# Patient Record
Sex: Male | Born: 1975
Health system: Southern US, Community
[De-identification: ages and names within clinical notes are randomized; demographics above are authoritative.]

## PROBLEM LIST (undated history)

## (undated) DIAGNOSIS — K219 Gastro-esophageal reflux disease without esophagitis: Secondary | ICD-10-CM

## (undated) DIAGNOSIS — R4689 Other symptoms and signs involving appearance and behavior: Secondary | ICD-10-CM

## (undated) DIAGNOSIS — E7800 Pure hypercholesterolemia, unspecified: Secondary | ICD-10-CM

## (undated) DIAGNOSIS — T7840XA Allergy, unspecified, initial encounter: Secondary | ICD-10-CM

## (undated) DIAGNOSIS — M722 Plantar fascial fibromatosis: Secondary | ICD-10-CM

## (undated) DIAGNOSIS — M5030 Other cervical disc degeneration, unspecified cervical region: Secondary | ICD-10-CM

## (undated) DIAGNOSIS — J309 Allergic rhinitis, unspecified: Secondary | ICD-10-CM

## (undated) DIAGNOSIS — F419 Anxiety disorder, unspecified: Secondary | ICD-10-CM

## (undated) HISTORY — DX: Pure hypercholesterolemia, unspecified: E78.00

## (undated) HISTORY — PX: ESOPHAGOGASTRODUODENOSCOPY: SHX1529

## (undated) HISTORY — DX: Plantar fascial fibromatosis: M72.2

## (undated) HISTORY — DX: Allergic rhinitis, unspecified: J30.9

## (undated) HISTORY — DX: Anxiety disorder, unspecified: F41.9

## (undated) HISTORY — DX: Other cervical disc degeneration, unspecified cervical region: M50.30

## (undated) HISTORY — DX: Allergy, unspecified, initial encounter: T78.40XA

---
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
## 1999-11-01 ENCOUNTER — Encounter: Admission: RE | Admit: 1999-11-01 | Discharge: 1999-11-01 | Payer: Self-pay | Admitting: Sports Medicine

## 2000-04-03 ENCOUNTER — Encounter: Admission: RE | Admit: 2000-04-03 | Discharge: 2000-04-03 | Payer: Self-pay | Admitting: Sports Medicine

## 2001-05-07 ENCOUNTER — Encounter: Admission: RE | Admit: 2001-05-07 | Discharge: 2001-05-07 | Payer: Self-pay | Admitting: Family Medicine

## 2001-12-12 ENCOUNTER — Encounter: Admission: RE | Admit: 2001-12-12 | Discharge: 2001-12-12 | Payer: Self-pay | Admitting: Sports Medicine

## 2004-01-18 HISTORY — PX: ANKLE SURGERY: SHX546

## 2007-04-26 ENCOUNTER — Telehealth: Payer: Self-pay | Admitting: *Deleted

## 2007-05-08 ENCOUNTER — Ambulatory Visit: Payer: Self-pay | Admitting: Sports Medicine

## 2007-05-08 ENCOUNTER — Telehealth (INDEPENDENT_AMBULATORY_CARE_PROVIDER_SITE_OTHER): Payer: Self-pay | Admitting: *Deleted

## 2007-05-08 DIAGNOSIS — F0781 Postconcussional syndrome: Secondary | ICD-10-CM | POA: Insufficient documentation

## 2007-05-08 DIAGNOSIS — M542 Cervicalgia: Secondary | ICD-10-CM | POA: Insufficient documentation

## 2008-12-05 ENCOUNTER — Ambulatory Visit: Payer: Self-pay | Admitting: Family Medicine

## 2008-12-05 DIAGNOSIS — S6000XA Contusion of unspecified finger without damage to nail, initial encounter: Secondary | ICD-10-CM | POA: Insufficient documentation

## 2009-09-09 ENCOUNTER — Ambulatory Visit: Payer: Self-pay | Admitting: Sports Medicine

## 2009-09-09 ENCOUNTER — Encounter: Admission: RE | Admit: 2009-09-09 | Discharge: 2009-09-09 | Payer: Self-pay | Admitting: Sports Medicine

## 2009-09-09 DIAGNOSIS — M545 Low back pain, unspecified: Secondary | ICD-10-CM | POA: Insufficient documentation

## 2009-10-05 ENCOUNTER — Ambulatory Visit: Payer: Self-pay | Admitting: Sports Medicine

## 2009-11-30 ENCOUNTER — Ambulatory Visit: Payer: Self-pay | Admitting: Sports Medicine
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
## 2010-02-18 NOTE — Assessment & Plan Note (Signed)
Summary: F/U [facility]   Vital Signs:  Patient profile:   35 year old male BP sitting:   124 / 79  Vitals Entered By: Lillia Pauls CMA (October 05, 2009 11:41 AM)  History of Present Illness: Starting to improve Driving hurts worst than other activities lifting hurst some but not as much Xray shows sclerotic bone injury near post elements  now 10 weeks out feels like he is close to turning corner not really having to use pain meds but is using calcium and Vit D  Physical Exam  General:  Well-developed,well-nourished,in no acute distress; alert,appropriate and cooperative throughout examination Msk:  now has full ext and flex of Low back no real significant pain to touch one leg hyperexten is OK ipsilateral feels some tightnes on contralat leg standing good rotation and lat bend  Xrays reviewed w patient  norm heel, toe and tandem walk no weakness   Impression & Recommendations:  Problem # 1:  LOW BACK PAIN, ACUTE (ICD-724.2)  His updated medication list for this problem includes:    Tramadol Hcl 50 Mg Tabs (Tramadol hcl) .Marland Kitchen... 1 tab by mouth bid  as needed pain not controlled by alleve  small fx of post element of vertebrae This appears to be sclerotic on Xray and healing clinically as well  add some weight exercises as tolerated grad progress all activity avoid prolonged sitting or standing when possible  wean meds as tolerated by pain < 3/10  reck if not better in 4 wks  Complete Medication List: 1)  Amitriptyline Hcl 50 Mg Tabs (Amitriptyline hcl) .Marland Kitchen.. 1 tab by mouth at bedtime 2)  Tramadol Hcl 50 Mg Tabs (Tramadol hcl) .Marland Kitchen.. 1 tab by mouth bid  as needed pain not controlled by [employer]
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
## 2010-09-14 ENCOUNTER — Encounter: Payer: Self-pay | Admitting: Sports Medicine

## 2010-09-14 ENCOUNTER — Ambulatory Visit (INDEPENDENT_AMBULATORY_CARE_PROVIDER_SITE_OTHER): Payer: No Typology Code available for payment source | Admitting: Sports Medicine

## 2010-09-14 VITALS — BP 110/75 | HR 65 | Ht 72.0 in | Wt 165.0 lb

## 2010-09-14 DIAGNOSIS — M79609 Pain in unspecified limb: Secondary | ICD-10-CM

## 2010-09-14 DIAGNOSIS — M722 Plantar fascial fibromatosis: Secondary | ICD-10-CM | POA: Insufficient documentation

## 2010-09-14 DIAGNOSIS — M79673 Pain in unspecified foot: Secondary | ICD-10-CM

## 2010-09-14 NOTE — Progress Notes (Signed)
  Subjective:    Patient ID: Casey Garcia, male    DOB: 1975/07/20, 35 y.o.   MRN: [id number]  HPI  1. Heel pain. First noticed a soreness one week ago after running a longer distance and trying to alter running form to push off toes/forefoot more. 3 days after this soreness he woke up one morning with more severe heel pain in left>right. Worse with standing and putting pressure on the area. Has tried ice and infrequent motrin, no change. Denies numbness, tingling, weakness, trauma, joint pain.  Has a history of plantar fasciitis that was nagging injury lasting ~6 years. Also had a left tarsal tunnel release procedure several years ago that did not change his pain pattern.  Is using orthotics daily.   Review of Systems See HPI.     Objective:   Physical Exam  Vitals reviewed. Constitutional: He is oriented to person, place, and time. He appears well-developed and well-nourished. No distress.  Musculoskeletal: Normal range of motion. He exhibits no edema and no tenderness.       Nontender over calcaneus, achilles, peroneal retinaculum, plantar fascia. No nodules or deformities. No edema. No pain with full ROM-plantar/dorsiflexion.  Sensation intact.  Neurological: He is alert and oriented to person, place, and time. Coordination normal.    MSK [place]: Plantar fascia bilaterally intact with no increased doppler flow. R measuring 0.33cm and left measures 0.31cm. No abnormal fibers. Left tarsal tunnel/peroneal tendons appear wnl.       Assessment & Plan:

## 2010-09-14 NOTE — Assessment & Plan Note (Addendum)
Pain likely due to plantar strain from altered running form on forefoot, although no inflammation visualized on [place] today. Will start plantar stretch, heel raises and toe walking for HEP. Arch straps bilaterally. Recommend ice and motrin prn. Will follow up in 6 weeks prn if not improved.   Note he had a hx of chronic PF for several yrs No objective evidence today but will use exercise and stretch as preventive

## 2011-07-04 ENCOUNTER — Emergency Department (HOSPITAL_COMMUNITY)
Admission: EM | Admit: 2011-07-04 | Discharge: 2011-07-04 | Disposition: A | Discharge status: Home / Self Care | Payer: No Typology Code available for payment source | Source: Home / Self Care

## 2011-07-04 ENCOUNTER — Encounter (HOSPITAL_COMMUNITY): Payer: Self-pay | Admitting: *Deleted

## 2011-07-04 DIAGNOSIS — H938X9 Other specified disorders of ear, unspecified ear: Secondary | ICD-10-CM

## 2011-07-04 NOTE — Discharge Instructions (Signed)
Thank you for coming in today. I am not sure exactly what is causing your sensation. Please call and make an appointment with [facility] ear nose and throat.   This should count is a referral but will be happy to send a letter if needed.  If it gets dramatically worse placed in the emergency room. Call the family practice Center to get set up with a new doctor or consider calling [facility] or [place] primary care.

## 2011-07-04 NOTE — ED Provider Notes (Signed)
Casey Garcia is a 36 y.o. male who presents to Urgent Care today for left ear sensation.  Patient is to 3 weeks of a squeaking or popping noise in his left ear.  This does not occur with pain or vertigo.  He denies any injury to his ear nor exposure to loud noises recently.  He feels well otherwise.    PMH reviewed. Otherwise healthy History  Substance Use Topics  . Smoking status: Never Smoker   . Smokeless tobacco: Not on file  . Alcohol Use: Yes   ROS as above Medications reviewed. No current facility-administered medications for this encounter.   No current outpatient prescriptions on file.    Exam:  BP 109/79  Pulse 64  Temp 98.4 F (36.9 C) (Oral)  Resp 16  SpO2 100% Gen: Well NAD HEENT: EOMI,  MMM, tympanic membranes are normal appearing bilaterally.  No TMJ pain with jaw motion. Lungs: CTABL Nl WOB Heart: RRR no MRG    Assessment and Plan: 36 y.o. male with left ear sensation.  His ears are normal appearing. His hearing appears to be normal as well.  As is sensation is been going on for 3 weeks I feel that referral to ear nose and throat is reasonable.  Recommend following up with [facility] for further evaluation and management. Discussed warning signs or symptoms. Please see discharge instructions. Patient expresses understanding.      Rodolph Bong, MD 07/04/11 [phone]

## 2011-07-04 NOTE — ED Provider Notes (Signed)
Medical screening examination/treatment/procedure(s) were performed by a resident physician and as supervising physician I was immediately available for consultation/collaboration.  Leslee Home, M.D.   Reuben Likes, MD 07/04/11 2101

## 2011-07-04 NOTE — ED Notes (Signed)
PT REPORTS LEFT EAR PAIN RATED AT 1/10

## 2012-04-16 ENCOUNTER — Ambulatory Visit (INDEPENDENT_AMBULATORY_CARE_PROVIDER_SITE_OTHER): Payer: BC Managed Care – PPO | Admitting: Family Medicine

## 2012-04-16 ENCOUNTER — Encounter: Payer: Self-pay | Admitting: Family Medicine

## 2012-04-16 VITALS — BP 116/77 | HR 73 | Ht 72.0 in | Wt 165.0 lb

## 2012-04-16 DIAGNOSIS — IMO0002 Reserved for concepts with insufficient information to code with codable children: Secondary | ICD-10-CM

## 2012-04-16 DIAGNOSIS — S76319A Strain of muscle, fascia and tendon of the posterior muscle group at thigh level, unspecified thigh, initial encounter: Secondary | ICD-10-CM | POA: Insufficient documentation

## 2012-04-16 DIAGNOSIS — S76311A Strain of muscle, fascia and tendon of the posterior muscle group at thigh level, right thigh, initial encounter: Secondary | ICD-10-CM

## 2012-04-16 NOTE — Progress Notes (Signed)
Subjective: 37 yo male presenting for evaluation of right hamstring injury.  Was playing soccer last night (3/30) and was running towards the ball and suddenly felt a "pop" in his posterior right thigh and went down to the field.  He continued to run and play the remainder of the first half and then took himself out secondary to pain.  Went home iced the injury and took Aleve for pain.  Since last night he has been ambulatory, complaining of right medial posterior thigh pain without radiation.  Pain is worse with hip and knee flexion.  Better with rest.  Denies any ecchymosis, buttocks pain, hip pain, knee pain, swelling.    Objective: BP 116/77  Pulse 73  Ht 6' (1.829 m)  Wt 165 lb (74.844 kg)  BMI 22.37 kg/m2 Gen:  NAD, well appearing.  Skin:  Well perfused, warm and dry.  Cap refill < 2 seconds Psych:  Alert and oriented x 3.  Msk:  Right thigh without obvious deformity or ecchymosis.  Tender to palpation on the posterior medial aspect of the thigh, with tight fibrous band noted.  ROM is decreased in hip flexion, full ROM in knee extension/flexion.  Strength 5/5 in hip, knee flexors/extensors.  Neurovascular intact distally on bilateral lower extremities.    Limited right hamstring ultrasound in transverse and long views demonstrated small hypoechogenicity in the hamstring muscle belly, no complete tear or rupture present.    Assessment: 37 yo male with acute onset hamstring pain following soccer injury last night with good hamstring strength, no ecchymosis, and ultrasound evidence of possible partial tear.    Plan:  1.  Right hamstring injury   --Discussed prognosis and prevention of future events with patient.  Handouts were given for hamstring strengthening exercises to be performed at home, he was educated in the office.  Compression sleeve given today in office to be worn during the day.  Asked the patient to please ice affected area twice daily and to take anti inflammatory for pain  control.  Instructed to withhold from running/kicking for 2 weeks then return to play as tolerated.    Stephanie Coup. Arvilla Market, DO [facility] Attending Note: I have seen and examined this patient. I have discussed this patient with the resident and reviewed the assessment and plan as documented above. I agree with the resident's findings and plan.

## 2012-04-16 NOTE — Patient Instructions (Addendum)
Hamstring curls: Start with 3 sets of 15 (no weight); Progress by 5 reps every 3 days until you reach 3 sets of 30; After 3 days at 3 sets of 30, add 2lb ankle weight at 3 sets of 10; Increase every 5 days by 5 reps. You may add 2lbs ankle weight once weekly. Hamstring swings- swing leg backwards and curl at the end of the swing. Follow same schedule as above. Hamstring running lunges- running lunge position means no more than 45 degrees of knee flexion and running motion. Follow same schedule as above.  Plyometrics: bound and land in running position; 3 sets of 15 level first; then up a rise; then down a rise; only advance when level is easy. No greater than 45 deg knee flexion. Stride drills- 10 x 75 yards; bounding but no more than 45 deg knee flexion. Level for 1 week; then add up hill for 1 week; then add down hill. Hop drills- 15 repetitive hops- 3 sets; compare injured to non injured leg

## 2012-04-17 ENCOUNTER — Encounter: Payer: Self-pay | Admitting: Family Medicine

## 2014-11-13 ENCOUNTER — Encounter (HOSPITAL_COMMUNITY): Payer: Self-pay | Admitting: *Deleted

## 2014-11-13 ENCOUNTER — Emergency Department (HOSPITAL_COMMUNITY)
Admission: EM | Admit: 2014-11-13 | Discharge: 2014-11-13 | Disposition: A | Discharge status: Home / Self Care | Payer: 59 | Source: Home / Self Care | Attending: Family Medicine | Admitting: Family Medicine

## 2014-11-13 DIAGNOSIS — J302 Other seasonal allergic rhinitis: Secondary | ICD-10-CM | POA: Diagnosis not present

## 2014-11-13 MED ORDER — METHYLPREDNISOLONE ACETATE 80 MG/ML IJ SUSP
INTRAMUSCULAR | Status: AC
  Filled 2014-11-13: qty 1

## 2014-11-13 MED ORDER — CETIRIZINE HCL 10 MG PO TABS: 10.0000 mg | ORAL_TABLET | Freq: Every day | ORAL | Status: DC

## 2014-11-13 MED ORDER — METHYLPREDNISOLONE ACETATE 40 MG/ML IJ SUSP
80.0000 mg | Freq: Once | INTRAMUSCULAR | Status: AC
  Administered 2014-11-13: 80 mg via INTRAMUSCULAR

## 2014-11-13 MED ORDER — FLUTICASONE PROPIONATE 50 MCG/ACT NA SUSP: 1.0000 | Freq: Two times a day (BID) | NASAL | Status: DC

## 2014-11-13 NOTE — ED Notes (Signed)
Pt   Reports   Symptoms  Of  Cough  /   Congestion       sorethroat    For  Several   Weeks       Symptoms  Not  releived  By otc  meds

## 2014-11-13 NOTE — ED Provider Notes (Signed)
CSN: [id number]     Arrival date & time 11/13/14  1936 History   First MD Initiated Contact with Patient 11/13/14 1955     Chief Complaint  Patient presents with  . URI   (Consider location/radiation/quality/duration/timing/severity/associated sxs/prior Treatment) Patient is a 39 y.o. male presenting with URI. The history is provided by the patient.  URI Presenting symptoms: congestion, cough, rhinorrhea and sore throat   Presenting symptoms: no fever   Severity:  Mild Onset quality:  Gradual Duration:  2 weeks Progression:  Waxing and waning Chronicity:  New Ineffective treatments:  OTC medications Associated symptoms: sneezing   Associated symptoms: no wheezing   Risk factors: sick contacts     History reviewed. No pertinent past medical history. History reviewed. No pertinent past surgical history. History reviewed. No pertinent family history. Social History  Substance Use Topics  . Smoking status: Never Smoker   . Smokeless tobacco: None  . Alcohol Use: Yes    Review of Systems  Constitutional: Negative.  Negative for fever.  HENT: Positive for congestion, postnasal drip, rhinorrhea, sneezing and sore throat.   Respiratory: Positive for cough. Negative for shortness of breath and wheezing.   Cardiovascular: Negative.   Gastrointestinal: Negative.   All other systems reviewed and are negative.   Allergies  Review of patient's allergies indicates no known allergies.  Home Medications   Prior to Admission medications   Medication Sig Start Date End Date Taking? Authorizing Provider  Fexofenadine HCl (MUCINEX ALLERGY PO) Take by mouth.   Yes Historical Provider, MD  cetirizine (ZYRTEC) 10 MG tablet Take 1 tablet (10 mg total) by mouth at bedtime. One tab daily for allergies 11/13/14   Billy Fischer, MD  fluticasone [facility]) 50 MCG/ACT nasal spray Place 1 spray into both nostrils 2 (two) times daily. 11/13/14   Billy Fischer, MD   Meds Ordered and Administered  this Visit   Medications  methylPREDNISolone acetate (DEPO-MEDROL) injection 80 mg (80 mg Intramuscular Given 11/13/14 2014)    BP 114/72 mmHg  Pulse 78  Temp(Src) 98.6 F (37 C)  Resp 18  SpO2 99% No data found.   Physical Exam  Constitutional: He is oriented to person, place, and time. He appears well-developed and well-nourished. No distress.  HENT:  Right Ear: External ear normal.  Left Ear: External ear normal.  Mouth/Throat: Oropharynx is clear and moist.  Eyes: Pupils are equal, round, and reactive to light.  Neck: Normal range of motion. Neck supple.  Cardiovascular: Normal heart sounds and intact distal pulses.   Pulmonary/Chest: Effort normal and breath sounds normal.  Lymphadenopathy:    He has no cervical adenopathy.  Neurological: He is alert and oriented to person, place, and time.  Skin: Skin is warm and dry.  Nursing note and vitals reviewed.   ED Course  Procedures (including critical care time)  Labs Review Labs Reviewed - No data to display  Imaging Review No results found.   Visual Acuity Review  Right Eye Distance:   Left Eye Distance:   Bilateral Distance:    Right Eye Near:   Left Eye Near:    Bilateral Near:         MDM   1. Seasonal allergic rhinitis        Billy Fischer, MD 11/13/14 2052

## 2015-03-17 LAB — BASIC METABOLIC PANEL
BUN: 17 mg/dL (ref 4–21)
CREATININE: 1 mg/dL (ref 0.6–1.3)
Glucose: 87 mg/dL
POTASSIUM: 4.1 mmol/L (ref 3.4–5.3)
Sodium: 140 mmol/L (ref 137–147)

## 2015-03-17 LAB — LIPID PANEL
CHOLESTEROL: 247 mg/dL — AB (ref 0–200)
HDL: 54 mg/dL (ref 35–70)
LDL Cholesterol: 174 mg/dL
TRIGLYCERIDES: 95 mg/dL (ref 40–160)

## 2015-03-17 LAB — CBC AND DIFFERENTIAL
HCT: 46 % (ref 41–53)
Hemoglobin: 15 g/dL (ref 13.5–17.5)
Platelets: 149 10*3/uL — AB (ref 150–399)
WBC: 3.6 10^3/mL

## 2015-03-17 LAB — HEPATIC FUNCTION PANEL
ALT: 43 U/L — AB (ref 10–40)
AST: 22 U/L (ref 14–40)
Alkaline Phosphatase: 52 U/L (ref 25–125)
BILIRUBIN, TOTAL: 0.5 mg/dL

## 2015-03-17 LAB — PSA: PSA: 0.2

## 2015-04-03 ENCOUNTER — Encounter: Payer: Self-pay | Admitting: Family Medicine

## 2015-04-03 ENCOUNTER — Ambulatory Visit (INDEPENDENT_AMBULATORY_CARE_PROVIDER_SITE_OTHER): Payer: 59 | Admitting: Family Medicine

## 2015-04-03 VITALS — BP 109/77 | HR 61 | Temp 97.8°F | Ht 72.0 in | Wt 177.0 lb

## 2015-04-03 DIAGNOSIS — R5383 Other fatigue: Secondary | ICD-10-CM | POA: Insufficient documentation

## 2015-04-03 DIAGNOSIS — L659 Nonscarring hair loss, unspecified: Secondary | ICD-10-CM

## 2015-04-03 DIAGNOSIS — E7800 Pure hypercholesterolemia, unspecified: Secondary | ICD-10-CM

## 2015-04-03 DIAGNOSIS — R200 Anesthesia of skin: Secondary | ICD-10-CM

## 2015-04-03 DIAGNOSIS — R5382 Chronic fatigue, unspecified: Secondary | ICD-10-CM

## 2015-04-03 LAB — TSH: TSH: 1.23 m[IU]/L (ref 0.40–4.50)

## 2015-04-03 MED ORDER — ATORVASTATIN CALCIUM 20 MG PO TABS: 20.0000 mg | ORAL_TABLET | Freq: Every day | ORAL | Status: DC

## 2015-04-03 NOTE — Assessment & Plan Note (Signed)
Nighttime extremity numbness consistent with nerve palsy from sleep position. Vascular and Neuro exam unremarkable. -patient to keep journal of symtpoms -patient reassured of likely benign etiology of symptoms.

## 2015-04-03 NOTE — Assessment & Plan Note (Signed)
Total cholesterol elevated on recent lab work. -Discussed lifestyle modifications, patient interested however is realistic that he will not be able to exercise regularly or consume a health diet. Interested in pharmacotherapy.  -start Lipitor 20 mg daily, recheck lipid panel in 3 months.

## 2015-04-03 NOTE — Progress Notes (Signed)
   Subjective:    Patient ID: Casey Garcia, male    DOB: 1975/08/11, 40 y.o.   MRN: [id number]  HPI 40 y/o male presents to re-establish care.  Arms/legs falling asleep - waking up multiple times per night with numb hand and feet. Used to be would fall asleep on hand and it would fall asleep, no happens in all positions, symptoms last approx. 5 minutes, does not notice symptoms during the day  Diet - elevated cholesterol, eats a poor diet, lots of red meat, few vegetables, lots of processed food  Previous hair loss - on Propecia, on Finasteride, no new changes in bladder function, no weak flow, no nocturia, still has some receding hairline. Has used foam rogaine in the past but saw little results.   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   Review of Systems  Constitutional: Negative for fever, chills and fatigue.  Respiratory: Negative for cough and shortness of breath.   Cardiovascular: Negative for chest pain.  Gastrointestinal: Negative for nausea, vomiting and diarrhea.       Objective:   Physical Exam BP 109/77 mmHg  Pulse 61  Temp(Src) 97.8 F (36.6 C) (Oral)  Ht 6' (1.829 m)  Wt 177 lb (80.287 kg)  BMI 24.00 kg/m2  Gen: pleasant male, NAD HEENT: normocephalic, PERRL, EOMI, nasal septum midline, MMM, uvula midline, neck supple, no thyromegaly, no adenopathy Cardiac: RRR, S1 and S2 present, no murmur, no heaves/thrills Resp: CTAB, normal effort Abd: soft, no tenderness, normal bowel sounds Neuro: CN 2-12 intact, strength 5/5 in bilateral UE/LE. Sensation to light touch grossly intact. Normal gait. 2+ patellar/brachialradialis reflexes, no clonus Vascular: no carotid bruits, 2+ radial and DP  pulses, extremities well perfused  Labs: 03/20/15 (copy scanned into chart, also entered under abstract encounter)  Chol. 247, HDL 54, LDL 174 CMP and CBC normal except WBC 3.6 UA normal PSA normal 0.2     Assessment & Plan:  Pure hypercholesterolemia Total cholesterol elevated on recent lab work. -Discussed lifestyle modifications, patient interested however is realistic that he will not be able to exercise regularly or consume a health diet. Interested in pharmacotherapy.  -start Lipitor 20 mg daily, recheck lipid panel in 3 months.   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 Patient on Propecia.  -patient given option to discontinue Propecia as he has noticed little improvement.  -re-evaluate at subsequent visits.   Extremity numbness Nighttime extremity numbness consistent with nerve palsy from sleep position. Vascular and Neuro exam unremarkable. -patient to keep journal of symtpoms -patient reassured of likely benign etiology of symptoms.

## 2015-04-03 NOTE — Patient Instructions (Addendum)
It was nice to see you again.   Hair loss - I will leave it up to you if you would like to stop the propecia  High Cholesterol - attempt to make small improvements in your diet, start Lipitor 20 mg daily.  Hand/Foot numbness - your circulation and nervous system are normal on my exam today. Keep a journal at your bedside - record what symptoms you have and what position you were in.   Return in 3 months for recheck of cholesterol. My come back sooner if you are having continued symptoms in your hands and feet.

## 2015-04-03 NOTE — Assessment & Plan Note (Signed)
Male pattern hair loss. Patient on Propecia.  -patient given option to discontinue Propecia as he has noticed little improvement.  -re-evaluate at subsequent visits.

## 2015-04-04 LAB — VITAMIN D 25 HYDROXY (VIT D DEFICIENCY, FRACTURES): Vit D, 25-Hydroxy: 19 ng/mL — ABNORMAL LOW (ref 30–100)

## 2015-04-06 ENCOUNTER — Telehealth: Payer: Self-pay | Admitting: Family Medicine

## 2015-04-06 DIAGNOSIS — E559 Vitamin D deficiency, unspecified: Secondary | ICD-10-CM

## 2015-04-06 NOTE — Telephone Encounter (Signed)
Discussed TSH and Vit D results. Start daily Vit D otc 1000-2000 IU Daily.

## 2015-04-14 ENCOUNTER — Encounter: Payer: Self-pay | Admitting: Family Medicine

## 2015-06-19 ENCOUNTER — Encounter: Payer: Self-pay | Admitting: Family Medicine

## 2015-06-19 ENCOUNTER — Ambulatory Visit (INDEPENDENT_AMBULATORY_CARE_PROVIDER_SITE_OTHER): Payer: 59 | Admitting: Family Medicine

## 2015-06-19 VITALS — BP 117/72 | HR 61 | Ht 72.0 in | Wt 178.2 lb

## 2015-06-19 DIAGNOSIS — E7800 Pure hypercholesterolemia, unspecified: Secondary | ICD-10-CM

## 2015-06-19 DIAGNOSIS — Z23 Encounter for immunization: Secondary | ICD-10-CM

## 2015-06-19 DIAGNOSIS — Q211 Atrial septal defect, unspecified: Secondary | ICD-10-CM | POA: Insufficient documentation

## 2015-06-19 DIAGNOSIS — R200 Anesthesia of skin: Secondary | ICD-10-CM | POA: Diagnosis not present

## 2015-06-19 DIAGNOSIS — M5030 Other cervical disc degeneration, unspecified cervical region: Secondary | ICD-10-CM

## 2015-06-19 LAB — LIPID PANEL
CHOL/HDL RATIO: 2.3 ratio (ref <=5.0)
CHOLESTEROL: 142 mg/dL (ref 125–200)
HDL: 61 mg/dL (ref >=40)
LDL Cholesterol: 67 mg/dL (ref <130)
TRIGLYCERIDES: 71 mg/dL (ref <150)
VLDL: 14 mg/dL (ref <30)

## 2015-06-19 NOTE — Assessment & Plan Note (Signed)
Patient reports previous C5-C6 cervical disc injury. DDD may be contributing to symptoms however I would expect daytime symptoms or + exam findings.  -no plan for imaging at this time however if hand numbness persists consider MRI cervical spine.

## 2015-06-19 NOTE — Assessment & Plan Note (Addendum)
Patient continues to have bilateral (R>L) arm/hand numbness at night with certain positions. Has also noted when laying in bed and one phone. DDX. Includes Cervical Spine Disease (known history of C5-C6 disk injury) vs Thoracic outlet syndrome. Neurologic and cervical spine exam unremarkable. Discussed possible nerve conduction study vs CTA to evaluate for nerve/vein/arterial impingement. Patient declined this workup at that time. -will check chest xray to evaluate ribs (first rib abnormalities have been shown to cause brachial plexus impingement).

## 2015-06-19 NOTE — Progress Notes (Signed)
   Subjective:    Patient ID: Casey Garcia, male    DOB: 06-12-1975, 40 y.o.   MRN: [id number]  HPI    Review of Systems     Objective:   Physical Exam        Assessment & Plan:

## 2015-06-19 NOTE — Patient Instructions (Signed)
It was nice to see you today.  Cholesterol - continue Lipitor, check levels today  Hand numbness - possibly thoracic outlet syndrome, check xray of chest, consider nerve conduction study and CT angiogram (to look at blood vessels in head/neck).

## 2015-06-19 NOTE — Assessment & Plan Note (Signed)
History of ASD with left to right shunt (shown on previous heart catheterization). Records scanned into chart (last cardiac visit was in 2004).  -check Echo.

## 2015-06-19 NOTE — Progress Notes (Signed)
   Subjective:    Patient ID: Casey Garcia, male    DOB: [birth year], 40 y.o.   MRN: [id number]  HPI 40 y/o male presents for follow up of hand numbness and HLD.  Hand Numbness - has continued to notice intermittent hand numbness in the middle of the night, typically occurs when arms are flexed/bent or when arm is up over the head. Has noticed some symptoms when he is on his phone during the day, symptoms resolve typically after 3-5 minutes. No change in color of the extremities. He also reports one episode of right leg/testicle numbness since last visit that lasted 5-10 minutes.   HLD - started on Lipitor 20 mg at last visit, no side effects  Atrioseptal defect - seen by cardiology in 2004, had heart catheter which showed left to right shunt, was supposed to get Echo in 3 years. (office notes scanned into chart)  Social - nonsmoker  Previous injuries:  Had C5-C6 injury 15 years ago (disk injury). Lumbar fracture in 2011 ([facility]. [facility])    Review of Systems     Objective:   Physical Exam BP 117/72 mmHg  Pulse 61  Ht 6' (1.829 m)  Wt 178 lb 3.2 oz (80.831 kg)  BMI 24.16 kg/m2    Lumbar Spine Xray 09/09/09 negative    Assessment & Plan:  Extremity numbness Patient continues to have bilateral (R>L) arm/hand numbness at night with certain positions. Has also noted when laying in bed and one phone. DDX. Includes Cervical Spine Disease (known history of C5-C6 disk injury) vs Thoracic outlet syndrome. Neurologic and cervical spine exam unremarkable. Discussed possible nerve conduction study vs CTA to evaluate for nerve/vein/arterial impingement. Patient declined this workup at that time. -will check chest xray to evaluate ribs (first rib abnormalities have been shown to cause brachial plexus impingement).   DDD (degenerative disc disease), cervical Patient reports previous C5-C6 cervical disc injury. DDD may be contributing to symptoms however I would expect  daytime symptoms or + exam findings.  -no plan for imaging at this time however if hand numbness persists consider MRI cervical spine.   Pure hypercholesterolemia Patient tolerating Lipitor started at last visit. -check lipid profile to monitor treatment   ASD (atrial septal defect) History of ASD with left to right shunt (shown on previous heart catheterization). Records scanned into chart (last cardiac visit was in 2004).  -check Echo.

## 2015-06-19 NOTE — Assessment & Plan Note (Signed)
Patient tolerating Lipitor started at last visit. -check lipid profile to monitor treatment

## 2015-06-20 LAB — HIV ANTIBODY (ROUTINE TESTING W REFLEX): HIV 1&2 Ab, 4th Generation: NONREACTIVE

## 2015-06-24 ENCOUNTER — Encounter: Payer: Self-pay | Admitting: Family Medicine

## 2015-07-08 ENCOUNTER — Other Ambulatory Visit (HOSPITAL_COMMUNITY): Payer: 59

## 2015-07-15 ENCOUNTER — Other Ambulatory Visit (HOSPITAL_COMMUNITY): Payer: 59

## 2015-07-17 ENCOUNTER — Ambulatory Visit (HOSPITAL_COMMUNITY): Payer: 59

## 2015-07-20 ENCOUNTER — Other Ambulatory Visit: Payer: Self-pay | Admitting: *Deleted

## 2015-07-20 DIAGNOSIS — E7800 Pure hypercholesterolemia, unspecified: Secondary | ICD-10-CM

## 2015-07-22 ENCOUNTER — Ambulatory Visit (HOSPITAL_COMMUNITY): Payer: 59 | Attending: Cardiovascular Disease

## 2015-07-22 ENCOUNTER — Other Ambulatory Visit: Payer: Self-pay

## 2015-07-22 DIAGNOSIS — Q211 Atrial septal defect, unspecified: Secondary | ICD-10-CM

## 2015-07-22 DIAGNOSIS — E785 Hyperlipidemia, unspecified: Secondary | ICD-10-CM | POA: Diagnosis not present

## 2015-07-22 DIAGNOSIS — I517 Cardiomegaly: Secondary | ICD-10-CM | POA: Diagnosis not present

## 2015-07-22 LAB — ECHOCARDIOGRAM COMPLETE
AOASC: 33 cm
CHL CUP DOP CALC LVOT VTI: 21.6 cm
E/e' ratio: 8.42
EWDT: 290 ms
FS: 38 % (ref 28–44)
IVS/LV PW RATIO, ED: 1.17
LA ID, A-P, ES: 37 mm
LA vol A4C: 43.4 ml
LA vol: 48.3 mL
LADIAMINDEX: 1.82 cm/m2
LAVOLIN: 23.8 mL/m2
LEFT ATRIUM END SYS DIAM: 37 mm
LV E/e' medial: 8.42
LV TDI E'LATERAL: 8.81
LV TDI E'MEDIAL: 8.92
LVEEAVG: 8.42
LVELAT: 8.81 cm/s
LVOT area: 4.91 cm2
LVOT diameter: 25 mm
LVOT peak vel: 98.3 cm/s
LVOTSV: 106 mL
MV Dec: 290
MVPG: 2 mmHg
MVPKAVEL: 38.3 m/s
MVPKEVEL: 74.2 m/s
PW: 9.81 mm — AB (ref 0.6–1.1)
TAPSE: 25 mm

## 2015-07-24 ENCOUNTER — Telehealth: Payer: Self-pay | Admitting: Family Medicine

## 2015-07-24 ENCOUNTER — Encounter: Payer: Self-pay | Admitting: Family Medicine

## 2015-07-24 ENCOUNTER — Telehealth: Payer: Self-pay | Admitting: Interventional Cardiology

## 2015-07-24 DIAGNOSIS — I517 Cardiomegaly: Secondary | ICD-10-CM | POA: Insufficient documentation

## 2015-07-24 NOTE — Telephone Encounter (Signed)
Attempted to contact patient with Echo results, no answer, left message that I would send letter with results.

## 2015-07-24 NOTE — Telephone Encounter (Signed)
Pt was referred to see Cardiologist and is a family friend of Dr. Tamala Julian so wants to see him-next available it 09-17-15-pt wants to know if he can be seen sooner-pls call

## 2015-07-24 NOTE — Telephone Encounter (Signed)
Pt called because he received a call from Dr. Ree Kida about the letter and also received a call from a cardiologist to make an appointment. He is now concerned and would like to speak to the doctor. Please call back. jw

## 2015-07-24 NOTE — Telephone Encounter (Signed)
Returned patient call. Discussed Echo findings. Mildly enlarged right ventricle/atrium. Referral to cardiology.

## 2015-07-27 NOTE — Telephone Encounter (Signed)
Returned pt call. Pt consult sch with Dr.Smith for 8/8 @ 4pm. Pt voiced appreciation for the call.

## 2015-08-25 ENCOUNTER — Encounter: Payer: Self-pay | Admitting: Interventional Cardiology

## 2015-08-25 ENCOUNTER — Ambulatory Visit (INDEPENDENT_AMBULATORY_CARE_PROVIDER_SITE_OTHER): Payer: 59 | Admitting: Interventional Cardiology

## 2015-08-25 VITALS — BP 106/74 | HR 61 | Ht 72.0 in | Wt 180.0 lb

## 2015-08-25 DIAGNOSIS — Q211 Atrial septal defect, unspecified: Secondary | ICD-10-CM

## 2015-08-25 NOTE — Patient Instructions (Addendum)
Medication Instructions:  Your physician recommends that you continue on your current medications as directed. Please refer to the Current Medication list given to you today.   Labwork: None ordered  Testing/Procedures: Your physician has requested that you have an echocardiogram. Echocardiography is a painless test that uses sound waves to create images of your heart. It provides your doctor with information about the size and shape of your heart and how well your heart's chambers and valves are working. This procedure takes approximately one hour. There are no restrictions for this procedure. (To be scheduled in 3 years)  Follow-Up: Your physician wants you to follow-up in: 3 years with cardiology You will receive a reminder letter in the mail two months in advance. If you don't receive a letter, please call our office to schedule the follow-up appointment.   Any Other Special Instructions Will Be Listed Below (If Applicable).     If you need a refill on your cardiac medications before your next appointment, please call your pharmacy.

## 2015-08-25 NOTE — Progress Notes (Signed)
Cardiology Office Note    Date:  08/25/2015   ID:  Casey Garcia, DOB September 09, 1975, MRN [id number]  PCP:  Lupita Dawn, MD  Cardiologist: Sinclair Grooms, MD   Chief Complaint  Patient presents with  . Atrial Septal Defect    History of Present Illness:  Casey Garcia is a 40 y.o. male with a prior history of atrial septal defect. Diagnosed in 2003. He is asymptomatic. He was told 14 years ago to have follow-up echo in 2006 . He is now following up at the encouragement of his primary care physician.  Evaluation in 2003  identified a small atrial septal defect without significant shunting. The ASD was apparently identified by both transthoracic echo and transesophageal echocardiography. Heart catheterization with a shunt run identified a 1.1:1 Qp/Qs.. Mr. Casey Garcia is asymptomatic. He has no complaints of exertional dyspnea, edema, syncope, palpitations, or physical limitations. He denies chest pain. He would not have come for an appointment had not been for his primary care physician who encouraged follow-up.  He complains that he does not sleep well. He is not known to snore.  There is a family history of coronary artery disease. He recently started statin therapy because of significant elevation in LDL cholesterol.  He does not smoke.  No past medical history on file.  Past Surgical History:  Procedure Laterality Date  . ANKLE SURGERY Left 2006   Tarsel Tunnel Release  . KNEE SURGERY  1990   Bilateral knee scope    Current Medications: Outpatient Medications Prior to Visit  Medication Sig Dispense Refill  . atorvastatin (LIPITOR) 20 MG tablet Take 1 tablet (20 mg total) by mouth daily. 90 tablet 3  . cholecalciferol (VITAMIN D) 1000 units tablet Take 1,000 Units by mouth daily.    . finasteride (PROPECIA) 1 MG tablet Take 1 mg by mouth daily.     No facility-administered medications prior to visit.      Allergies:   Review of patient's allergies indicates no known  allergies.   Social History   Social History  . Marital status: Single    Spouse name: N/A  . Number of children: N/A  . Years of education: N/A   Social History Main Topics  . Smoking status: Never Smoker  . Smokeless tobacco: Never Used  . Alcohol use 4.2 - 7.2 oz/week    7 - 12 Standard drinks or equivalent per week  . Drug use: No  . Sexual activity: Not Asked   Other Topics Concern  . None   Social History Chief Operating Officer, mostly apartments (historic rehabilitation)   Not married, no kids   Grew up in [place]        Family History:  The patient's father had coronary bypass surgery at age 63. Myocardial infarction prior to age 96.  ROS:   Please see the history of present illnessdifficulty sleeping. Anxiety and stress. Muscle pain. Occasional diarrhea.   All other systems reviewed and are negative.   PHYSICAL EXAM:   VS:  BP 106/74   Pulse 61   Ht 6' (1.829 m)   Wt 180 lb (81.6 kg)   BMI 24.41 kg/m    GEN: Well nourished, well developed, in no acute distress  HEENT: normal  Neck: no JVD, carotid bruits, or masses Cardiac:RRR; no murmurs, rubs, or gallops,no edema . S2 is physiologically split.  Respiratory:  clear to auscultation bilaterally, normal work of breathing GI: soft, nontender, nondistended, +  BS MS: no deformity or atrophy  Skin: warm and dry, no rash Neuro:  Alert and Oriented x 3, Strength and sensation are intact Psych: euthymic mood, full affect  Wt Readings from Last 3 Encounters:  08/25/15 180 lb (81.6 kg)  06/19/15 178 lb 3.2 oz (80.8 kg)  04/03/15 177 lb (80.3 kg)      Studies/Labs Reviewed:   EKG:  EKG  Normal sinus rhythm, incomplete right bundle, normal atrial size, and no significant ST-T wave change. When compared to his electrocardiogram performed on 03/14/2001 no significant changes noted Recent Labs: 03/17/2015: ALT 43; BUN 17; Creatinine 1.0; Hemoglobin 15.0; Platelets 149; Potassium 4.1; Sodium  140 04/03/2015: TSH 1.23   Lipid Panel    Component Value Date/Time   CHOL 142 06/19/2015 0900   TRIG 71 06/19/2015 0900   HDL 61 06/19/2015 0900   CHOLHDL 2.3 06/19/2015 0900   VLDL 14 06/19/2015 0900   LDLCALC 67 06/19/2015 0900    Additional studies/ records that were reviewed today include:  Echocardiogram, 07/22/15  Study Conclusions  - Left ventricle: The cavity size was normal. Systolic function was   normal. The estimated ejection fraction was in the range of 55%   to 60%. Wall motion was normal; there were no regional wall   motion abnormalities. Left ventricular diastolic function   parameters were normal.  Impressions:  - Borderline enlargement of right heart cavities. An atrial septal   defect is not identified on 2D or color Doppler imaging. Flow   calculations do not support a significant left to right shunt. If   there is a high level of suspicion for ASD, consider TEE   evaluation.   ASSESSMENT:    1. ASD (atrial septal defect)      PLAN:  In order of problems listed above:  1. Based upon review of available data, it is likely that Casey Garcia has a small ASD. However, ASD was not confirmed by the recent  transthoracic echo on the particular examination noted above. A TEE, transthoracic echo, and heart catheterization in 2003 all suggested the presence of a small ASD or a patent foramen ovale. The finding of "borderline enlargement of right heart cavities" raises some concern. I have reviewed the echocardiographic data personally. I don't disagree with the assessment noted. There is no evidence of atrial septal shunting by Doppler interrogation. The patient has no particular symptoms. There are no clinical findings to suggest right heart volume overload. The plan is follow-up echocardiogram in 2- 3 years. Earlier evaluation if symptoms or signs of volume overload which would include edema, exertional intolerance, and arrhythmia.      Review of available  data which includes Medication Adjustments/Labs and Tests Ordered: Current medicines are reviewed at length with the patient today.  Concerns regarding medicines are outlined above.  Medication changes, Labs and Tests ordered today are listed in the Patient Instructions below. Patient Instructions  Medication Instructions:  Your physician recommends that you continue on your current medications as directed. Please refer to the Current Medication list given to you today.   Labwork: None ordered  Testing/Procedures: Your physician has requested that you have an echocardiogram. Echocardiography is a painless test that uses sound waves to create images of your heart. It provides your doctor with information about the size and shape of your heart and how well your heart's chambers and valves are working. This procedure takes approximately one hour. There are no restrictions for this procedure. (To be scheduled in 3 years)  Follow-Up: Your physician wants you to follow-up in: 3 years with cardiology You will receive a reminder letter in the mail two months in advance. If you don't receive a letter, please call our office to schedule the follow-up appointment.   Any Other Special Instructions Will Be Listed Below (If Applicable).     If you need a refill on your cardiac medications before your next appointment, please call your pharmacy.      Signed, Sinclair Grooms, MD  08/25/2015 5:25 PM    [facility] [address], [place], [place]  [address] Phone: [phone]; Fax: [phone]

## 2015-11-23 ENCOUNTER — Telehealth: Payer: Self-pay

## 2015-11-23 DIAGNOSIS — E7800 Pure hypercholesterolemia, unspecified: Secondary | ICD-10-CM

## 2015-11-23 MED ORDER — ATORVASTATIN CALCIUM 20 MG PO TABS: 20.0000 mg | ORAL_TABLET | Freq: Every day | ORAL | 3 refills | Status: DC

## 2015-11-23 NOTE — Telephone Encounter (Signed)
Refill sent to pharmacy.   

## 2015-11-23 NOTE — Telephone Encounter (Signed)
Pt called in requesting a 3 month supply of his atorvastatin. Please send to the [facility] pharmacy on file. Nat Christen, CMA

## 2016-01-07 ENCOUNTER — Encounter: Payer: Self-pay | Admitting: Sports Medicine

## 2016-01-07 ENCOUNTER — Ambulatory Visit: Payer: Self-pay

## 2016-01-07 ENCOUNTER — Ambulatory Visit (INDEPENDENT_AMBULATORY_CARE_PROVIDER_SITE_OTHER): Payer: Commercial Managed Care - HMO | Admitting: Sports Medicine

## 2016-01-07 VITALS — BP 110/70 | Ht 72.0 in | Wt 170.0 lb

## 2016-01-07 DIAGNOSIS — S76011A Strain of muscle, fascia and tendon of right hip, initial encounter: Secondary | ICD-10-CM | POA: Diagnosis not present

## 2016-01-07 DIAGNOSIS — M25551 Pain in right hip: Secondary | ICD-10-CM

## 2016-01-07 MED ORDER — NITROGLYCERIN 0.2 MG/HR TD PT24: MEDICATED_PATCH | TRANSDERMAL | 1 refills | Status: DC

## 2016-01-07 NOTE — Assessment & Plan Note (Signed)
Seen on ultrasound of the gluteus maximus.  He will try nitroglycerin patches. He will do exercises daily for strengthening the hip. Follow-up in 3-4 weeks. He did start running in a week or 2 when he is doing exercises without pain. If he is doing better he does not need to follow up in 3-4 weeks.

## 2016-01-07 NOTE — Progress Notes (Signed)
  Casey Garcia - 40 y.o. male MRN [id number]  Date of birth: November 21, 1975  SUBJECTIVE:  Including CC & ROS.  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 He plays recreational soccer and has been trying to get back into running. He reports that during a game 2 weeks ago, he started having right hip pain on his gluteal region. He does not remember a specific injury or feeling a pop at the time. No swelling or ecchymosis to that area. He noticed the pain gradually increase as he is playing the game and he had significant pain the day after. He reports that his pain has steadily become better but is not fully improved. He tried running after this occurred and he felt significant pain so that he could not do this. He reports pain with certain movements but cannot pinpoint which once make it worse. Denies any pain down his leg.   ROS: No unexpected weight loss, fever, chills, swelling, instability, muscle pain, numbness/tingling, redness, otherwise see HPI   PMHx - Updated and reviewed.  Contributory factors include: Negative PSHx - Updated and reviewed.  Contributory factors include:  Negative FHx - Updated and reviewed.  Contributory factors include:  Negative Social Hx - Updated and reviewed. Contributory factors include: Negative Medications - reviewed   DATA REVIEWED: None  PHYSICAL EXAM:  VS: BP:110/70  HR: bpm  TEMP: ( )  RESP:   HT:6' (182.9 cm)   WT:170 lb (77.1 kg)  BMI:23.1 PHYSICAL EXAM: Gen: NAD, alert, cooperative with exam, well-appearing HEENT: clear conjunctiva,  CV:  no edema, capillary refill brisk, normal rate Resp: non-labored Skin: no rashes, normal turgor  Neuro: no gross deficits.  Psych:  alert and oriented  Hip: ROM IR: 45 Deg, ER: 45 Deg, Flexion: 120 Deg, Extension: 100 Deg, Abduction: 45 Deg, Adduction: 45 Deg Strength IR: 5/5, ER: 5/5, Flexion: 5/5, Extension: 5/5, Abduction: 5/5, Adduction: 5/5 Pelvic alignment  unremarkable to inspection and palpation. Standing hip rotation and gait without trendelenburg sign / unsteadiness. Greater trochanter without tenderness to palpation. No tenderness over piriformis. Right lateral superior aspect of the gluteus muscle. No pain with FABER or FADIR. No SI joint tenderness and normal minimal SI movement.  Ultrasound: Limited ultrasound of the right gluteal region was obtained. Greater trochanter was viewed without any swelling.  Gluteus maximus was viewed and showed a small area of disruption of the fibrillary pattern with edema. No discrete tearing present. This was viewed and long and short axis. Findings consistent with right gluteus maximus muscle strain.   ASSESSMENT & PLAN:   Muscle strain of right gluteal region Seen on ultrasound of the gluteus maximus.  He will try nitroglycerin patches. He will do exercises daily for strengthening the hip. Follow-up in 3-4 weeks. He did start running in a week or 2 when he is doing exercises without pain. If he is doing better he does not need to follow up in 3-4 weeks.

## 2016-01-07 NOTE — Patient Instructions (Signed)

## 2016-03-01 DIAGNOSIS — L649 Androgenic alopecia, unspecified: Secondary | ICD-10-CM | POA: Diagnosis not present

## 2016-03-02 DIAGNOSIS — J029 Acute pharyngitis, unspecified: Secondary | ICD-10-CM | POA: Diagnosis not present

## 2016-03-10 ENCOUNTER — Ambulatory Visit (INDEPENDENT_AMBULATORY_CARE_PROVIDER_SITE_OTHER): Payer: Commercial Managed Care - HMO | Admitting: Sports Medicine

## 2016-03-10 ENCOUNTER — Encounter: Payer: Self-pay | Admitting: Sports Medicine

## 2016-03-10 ENCOUNTER — Ambulatory Visit
Admission: RE | Admit: 2016-03-10 | Discharge: 2016-03-10 | Disposition: A | Discharge status: Home / Self Care | Payer: Commercial Managed Care - HMO | Source: Ambulatory Visit | Attending: Sports Medicine | Admitting: Sports Medicine

## 2016-03-10 VITALS — BP 104/82 | Ht 72.0 in | Wt 170.0 lb

## 2016-03-10 DIAGNOSIS — M25551 Pain in right hip: Secondary | ICD-10-CM

## 2016-03-10 NOTE — Progress Notes (Signed)
  Casey Garcia - 41 y.o. male MRN [id number]  Date of birth: 1975/05/03  SUBJECTIVE:  Including CC & ROS.  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 He had first felt the pain in his gluteal region when he was playing soccer, but could not remember a specific injury. He was given nitroclygerin patches to wear and gluteal strengthening exercises. He wore the patches for about a week and a half and attempted to run about two weeks after with no improvement in pain. He rested for a week then ran again with pain. He reports that the pain is in his upper right gluteal region and anterior hip. He cannot pinpoint any other activities that cause pain. He does not stretch often and says that he has always been very tight.   ROS: No unexpected weight loss, fever, chills, swelling, instability,  numbness/tingling, redness, otherwise see HPI   PMHx - Updated and reviewed.  Contributory factors include: Negative PSHx - Updated and reviewed.  Contributory factors include:  Negative FHx - Updated and reviewed.  Contributory factors include:  Negative Social Hx - Updated and reviewed. Contributory factors include: Negative Medications - reviewed   DATA REVIEWED: None  PHYSICAL EXAM:  VS: BP:104/82  HR: bpm  TEMP: ( )  RESP:   HT:6' (182.9 cm)   WT:170 lb (77.1 kg)  BMI:23.1 PHYSICAL EXAM: Gen: NAD, alert, cooperative with exam, well-appearing HEENT: clear conjunctiva,  CV:  no edema, capillary refill brisk, normal rate Resp: non-labored Skin: no rashes, normal turgor  Neuro: no gross deficits.  Psych:  alert and oriented  Hip: ROM IR: 45 Deg, ER: 45 Deg, Flexion: 120 Deg, Extension: 100 Deg, Abduction: 45 Deg, Adduction: 45 Deg Strength IR: 5/5, ER: 5/5, Flexion: 5/5, Extension: 5/5, Abduction: 5/5, Adduction: 5/5 Pelvic alignment while lying down is rotated to the  right Standing hip rotation and gait without trendelenburg sign / unsteadiness. Greater trochanter without tenderness to palpation. No tenderness over piriformis. Right lateral superior aspect of the gluteus muscle. Mild pain with FADIR, no pain with FABER but limited mobiltiy No SI joint tenderness, R SI joint with limited mobility, significantly less than left  12/21 Ultrasound: Limited ultrasound of the right gluteal region was obtained. Greater trochanter was viewed without any swelling.  Gluteus maximus was viewed and showed a small area of disruption of the fibrillary pattern with edema. No discrete tearing present. This was viewed and long and short axis. Findings consistent with right gluteus maximus muscle strain.   ASSESSMENT & PLAN:   Right hip pain Pain with FADIR shows irritation of labrum, although it is mild. 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

## 2016-03-10 NOTE — Patient Instructions (Signed)
Referral to PT  We will call with XR

## 2016-03-10 NOTE — Assessment & Plan Note (Addendum)
Pain with FADIR shows possible irritation of labrum, although it is mild. 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

## 2016-03-29 ENCOUNTER — Ambulatory Visit: Payer: Commercial Managed Care - HMO

## 2016-05-12 ENCOUNTER — Telehealth: Payer: Self-pay | Admitting: *Deleted

## 2016-05-12 NOTE — Telephone Encounter (Signed)
Order and notes faxed over to [facility] office

## 2016-05-17 DIAGNOSIS — M25551 Pain in right hip: Secondary | ICD-10-CM | POA: Diagnosis not present

## 2016-05-17 DIAGNOSIS — S76311D Strain of muscle, fascia and tendon of the posterior muscle group at thigh level, right thigh, subsequent encounter: Secondary | ICD-10-CM | POA: Diagnosis not present

## 2016-05-26 DIAGNOSIS — M25551 Pain in right hip: Secondary | ICD-10-CM | POA: Diagnosis not present

## 2016-05-26 DIAGNOSIS — S76311D Strain of muscle, fascia and tendon of the posterior muscle group at thigh level, right thigh, subsequent encounter: Secondary | ICD-10-CM | POA: Diagnosis not present

## 2016-05-31 DIAGNOSIS — S76311D Strain of muscle, fascia and tendon of the posterior muscle group at thigh level, right thigh, subsequent encounter: Secondary | ICD-10-CM | POA: Diagnosis not present

## 2016-05-31 DIAGNOSIS — M25551 Pain in right hip: Secondary | ICD-10-CM | POA: Diagnosis not present

## 2016-06-06 DIAGNOSIS — S76311D Strain of muscle, fascia and tendon of the posterior muscle group at thigh level, right thigh, subsequent encounter: Secondary | ICD-10-CM | POA: Diagnosis not present

## 2016-06-06 DIAGNOSIS — M25551 Pain in right hip: Secondary | ICD-10-CM | POA: Diagnosis not present

## 2016-06-14 DIAGNOSIS — M25551 Pain in right hip: Secondary | ICD-10-CM | POA: Diagnosis not present

## 2016-06-14 DIAGNOSIS — S76311D Strain of muscle, fascia and tendon of the posterior muscle group at thigh level, right thigh, subsequent encounter: Secondary | ICD-10-CM | POA: Diagnosis not present

## 2016-06-20 DIAGNOSIS — M25551 Pain in right hip: Secondary | ICD-10-CM | POA: Diagnosis not present

## 2016-06-20 DIAGNOSIS — S76311D Strain of muscle, fascia and tendon of the posterior muscle group at thigh level, right thigh, subsequent encounter: Secondary | ICD-10-CM | POA: Diagnosis not present

## 2016-06-24 ENCOUNTER — Telehealth: Payer: Self-pay | Admitting: Family Medicine

## 2016-06-24 NOTE — Telephone Encounter (Signed)
Attempted to call pt no answer, will try again later. Deseree Blount, CMA  

## 2016-06-24 NOTE — Telephone Encounter (Signed)
Please schedule patient for an appointment. He will need to be seen in office prior to a referral.

## 2016-06-24 NOTE — Telephone Encounter (Signed)
Pt called and would like to have a referral to see an allergist. Casey Garcia

## 2016-07-12 DIAGNOSIS — S76311D Strain of muscle, fascia and tendon of the posterior muscle group at thigh level, right thigh, subsequent encounter: Secondary | ICD-10-CM | POA: Diagnosis not present

## 2016-07-12 DIAGNOSIS — M25551 Pain in right hip: Secondary | ICD-10-CM | POA: Diagnosis not present

## 2016-07-13 DIAGNOSIS — J301 Allergic rhinitis due to pollen: Secondary | ICD-10-CM | POA: Diagnosis not present

## 2016-07-13 DIAGNOSIS — J309 Allergic rhinitis, unspecified: Secondary | ICD-10-CM | POA: Diagnosis not present

## 2016-07-13 DIAGNOSIS — Z0182 Encounter for allergy testing: Secondary | ICD-10-CM | POA: Diagnosis not present

## 2016-07-13 DIAGNOSIS — J3089 Other allergic rhinitis: Secondary | ICD-10-CM | POA: Diagnosis not present

## 2016-08-12 ENCOUNTER — Encounter: Payer: Self-pay | Admitting: Family Medicine

## 2016-08-12 ENCOUNTER — Ambulatory Visit (INDEPENDENT_AMBULATORY_CARE_PROVIDER_SITE_OTHER): Payer: 59 | Admitting: Family Medicine

## 2016-08-12 DIAGNOSIS — J329 Chronic sinusitis, unspecified: Secondary | ICD-10-CM | POA: Diagnosis not present

## 2016-08-12 MED ORDER — AMOXICILLIN-POT CLAVULANATE 875-125 MG PO TABS: 1.0000 | ORAL_TABLET | Freq: Two times a day (BID) | ORAL | 0 refills | Status: DC

## 2016-08-12 NOTE — Assessment & Plan Note (Signed)
Treating nasal drainage is chronic sinusitis. 2 weeks of Augmentin. This may need to be extended. I agree with the patient this does not necessarily sound like allergies. He has had no history of allergies in the past. He does not have any nasal or eye itching or clear drainage. On exam he has thick purulent discharge in bilateral nares. Follow-up in 2 weeks after he has taken the 2 weeks of the Augmentin. If persistent may need to extend round of antibiotics versus referral to ENT

## 2016-08-12 NOTE — Progress Notes (Signed)
Subjective:    Casey Garcia is a 41 y.o. male who presents to [facility] today for chronic nasal drainage:  1.  Chronic nasal drainage:  Present for the past 6 months or so. He presented initially back then 2 minute clinic after several days of nasal and sinus congestion. He was told he had a respiratory virus that should clear. However over time this did not clear. He has tried multiple rounds of the Zyrtec plus Zyrtec-D as well as Flonase without much relief. He has been using these for at least the past 3 months.  To 3 weeks ago who presents with allergy specialist. The specialist added Astelin plus the Zyrtec and Flonase. He has not noticed any change with the Astelin. He still has daily thick nasal congestion. He denies that it is any actual drainage. He states that the thickness makes his nostrils sick together. He's had no fevers or chills. He feels some congestion in his sinuses. He's had no itchy runny eyes. No drainage of clear or thin fluid from his nose. No nasal itching. His had no history of seasonal allergies in the past.  NO hearing changes.  NO vertigo. No sore throat. He did have sore throat and some subjective fevers back when he had the initial viral attack about 6 months ago but none since then. He is eating and drinking well.  ROS as above.   The following portions of the patient's history were reviewed and updated as appropriate: allergies, current medications, past medical history, family and social history, and problem list. Patient is a nonsmoker.    PMH reviewed.  No past medical history on file. Past Surgical History:  Procedure Laterality Date  . ANKLE SURGERY Left 2006   Tarsel Tunnel Release  . KNEE SURGERY  1990   Bilateral knee scope    Medications reviewed. Current Outpatient Prescriptions  Medication Sig Dispense Refill  . atorvastatin (LIPITOR) 20 MG tablet Take 1 tablet (20 mg total) by mouth daily. 90 tablet 3  . cholecalciferol (VITAMIN D) 1000 units  tablet Take 1,000 Units by mouth daily.    . finasteride (PROPECIA) 1 MG tablet Take 1 mg by mouth daily.    . nitroGLYCERIN (NITRODUR - DOSED IN MG/24 HR) 0.2 mg/hr patch Use 1/4 patch daily to the affected area 30 patch 1   No current facility-administered medications for this visit.      Objective:   Physical Exam BP 120/78   Pulse 61   Temp 98.1 F (36.7 C) (Oral)   Ht 6' (1.829 m)   Wt 172 lb 6.4 oz (78.2 kg)   SpO2 98%   BMI 23.38 kg/m  Gen:  Patient sitting on exam table, appears stated age in no acute distress Head: Normocephalic atraumatic Eyes: EOMI, PERRL, sclera and conjunctiva non-erythematous Ears:  Canals clear bilaterally.  TMs pearly gray bilaterally without erythema or bulging.   Nose:  Nasal turbinates without any edema or enlargement.  With thick, purulent exudates noted BL nares.  Sinuses nontender to palpation Mouth: Mucosa membranes moist. Tonsils +2, nonenlarged, non-erythematous. Neck: No cervical lymphadenopathy noted    No results found for this or any previous visit (from the past 72 hour(s)).

## 2016-08-12 NOTE — Patient Instructions (Addendum)
It was great to meet you today  Take the Augmentin twice daily x 2 weeks.  Stop all of the nasal sprays.  See how you're doing in the next several days. If you've started to have worsening runny nose or itching you can retake the Zyrtec for couple days just to get over this. After 2-3 days stop Zyrtec and see how things are going.  Come back and see me in about 2 weeks. We'll see how you're doing at that point. I will also see if you need to see Ear, Nose, and Throat after that.

## 2016-09-26 ENCOUNTER — Other Ambulatory Visit: Payer: Self-pay | Admitting: *Deleted

## 2016-09-26 DIAGNOSIS — E7800 Pure hypercholesterolemia, unspecified: Secondary | ICD-10-CM

## 2016-09-26 MED ORDER — ATORVASTATIN CALCIUM 20 MG PO TABS: 20.0000 mg | ORAL_TABLET | Freq: Every day | ORAL | 3 refills | Status: DC

## 2016-10-03 ENCOUNTER — Ambulatory Visit (INDEPENDENT_AMBULATORY_CARE_PROVIDER_SITE_OTHER): Payer: 59 | Admitting: Family Medicine

## 2016-10-03 ENCOUNTER — Encounter: Payer: Self-pay | Admitting: Family Medicine

## 2016-10-03 VITALS — BP 118/70 | HR 59 | Temp 98.0°F | Ht 72.0 in | Wt 173.0 lb

## 2016-10-03 DIAGNOSIS — J329 Chronic sinusitis, unspecified: Secondary | ICD-10-CM | POA: Diagnosis not present

## 2016-10-03 NOTE — Progress Notes (Signed)
Subjective:    Casey Garcia is a 41 y.o. male who presents to [facility] today for continued sinus issues:  1.  Sinus problems:  With persistent sinus congestion. He has stopped all nasal inhalers. He took the Augmentin for 2 weeks and felt some improvement with this however a few days after stopping he began noticing recurrence of symptoms. His symptoms are drainage of thick purulent appearing mucus. He still has trouble with his nostrils "sticking together."  Difficulty blowing his nose due to thickness of mucus. No actual sinus pressure or headache. No cough. No sore throat. No fevers or chills.   ROS as above per HPI.    The following portions of the patient's history were reviewed and updated as appropriate: allergies, current medications, past medical history, family and social history, and problem list. Patient is a nonsmoker.    PMH reviewed.  No past medical history on file. Past Surgical History:  Procedure Laterality Date  . ANKLE SURGERY Left 2006   Tarsel Tunnel Release  . KNEE SURGERY  1990   Bilateral knee scope    Medications reviewed. Current Outpatient Prescriptions  Medication Sig Dispense Refill  . amoxicillin-clavulanate (AUGMENTIN) 875-125 MG tablet Take 1 tablet by mouth 2 (two) times daily. 28 tablet 0  . atorvastatin (LIPITOR) 20 MG tablet Take 1 tablet (20 mg total) by mouth daily. 90 tablet 3  . cholecalciferol (VITAMIN D) 1000 units tablet Take 1,000 Units by mouth daily.    . finasteride (PROPECIA) 1 MG tablet Take 1 mg by mouth daily.    . nitroGLYCERIN (NITRODUR - DOSED IN MG/24 HR) 0.2 mg/hr patch Use 1/4 patch daily to the affected area 30 patch 1   No current facility-administered medications for this visit.      Objective:   Physical Exam BP 118/70   Pulse (!) 59   Temp 98 F (36.7 C) (Oral)   Ht 6' (1.829 m)   Wt 173 lb (78.5 kg)   SpO2 98%   BMI 23.46 kg/m  Gen:  Patient sitting on exam table, appears stated age in no acute  distress Head: Normocephalic atraumatic Eyes: EOMI, PERRL, sclera and conjunctiva non-erythematous Ears:  Canals clear bilaterally.  TMs pearly gray bilaterally without erythema or bulging.   Nose:  Nasal turbinates None erythematous and nonedematous. I do note some purulence posterior nasopharynx. Mouth: Mucosa membranes moist. Tonsils +2, nonenlarged, non-erythematous. Neck: No cervical lymphadenopathy noted   No results found for this or any previous visit (from the past 72 hour(s)).

## 2016-10-03 NOTE — Patient Instructions (Signed)
It was good to see you again today.  I'm going to refer you to an Ear, Nose, and Throat doctor.  We will contact their office and they will contact you with an appointment within the next 1-1/2 weeks. If you haven't heard anything with that point contact us.  If it's going to be several weeks before you are seen at ENT, contact me through my chart and we can try another round of antibiotics. Otherwise I would like for you to be seen there off of any sort of medication.  If you have any questions do not hesitate to reach out. You can schedule a follow-up see me again after you're seen by ENT.

## 2016-10-05 NOTE — Assessment & Plan Note (Signed)
Persists.  No evidence of acute infection, no fevers or chills currently.   Discussed various treatment options including longer course of antibiotics and referral to otolaryngologist. After discussion patient desires referral to otolaryngologist for better intranasal examination and recommendations. Will send referral today

## 2016-10-24 DIAGNOSIS — K219 Gastro-esophageal reflux disease without esophagitis: Secondary | ICD-10-CM | POA: Diagnosis not present

## 2016-10-24 DIAGNOSIS — J329 Chronic sinusitis, unspecified: Secondary | ICD-10-CM | POA: Diagnosis not present

## 2016-12-05 DIAGNOSIS — J329 Chronic sinusitis, unspecified: Secondary | ICD-10-CM | POA: Diagnosis not present

## 2016-12-05 DIAGNOSIS — R220 Localized swelling, mass and lump, head: Secondary | ICD-10-CM | POA: Diagnosis not present

## 2017-01-02 ENCOUNTER — Encounter: Payer: Self-pay | Admitting: Family Medicine

## 2017-01-06 ENCOUNTER — Encounter: Payer: Self-pay | Admitting: Family Medicine

## 2017-01-06 ENCOUNTER — Other Ambulatory Visit: Payer: Self-pay

## 2017-01-06 ENCOUNTER — Ambulatory Visit: Payer: 59 | Admitting: Family Medicine

## 2017-01-06 VITALS — BP 104/66 | Temp 97.9°F | Ht 72.0 in | Wt 171.4 lb

## 2017-01-06 DIAGNOSIS — J310 Chronic rhinitis: Secondary | ICD-10-CM

## 2017-01-06 NOTE — Patient Instructions (Signed)
I would recommend trying to cut back on environmental exposures as you discussed. Cut back on cheese for a couple of weeks.  Consider moving to a hardwood floor area without carpets for about a month.    Switch to just once a day Flonase Sensimist (blue label) saline based rather than alcohol based.    If those aren't really helping, let's look back at the allergist.    Let's hold off on the surgery for a while at least.

## 2017-01-06 NOTE — Progress Notes (Signed)
Subjective:    Casey Garcia is a 41 y.o. male who presents to [facility] today for FU for dry nose:  1.  Dry itchy nose: Ongoing.  Ongoing.  He has been evaluated by ear nose and throat and had a trial of antibiotics with only minimal improvement.  He had a CT scan of the sinuses which was completely negative.  Currently being treated as chronic allergic rhinitis.  He continues to take his Zyrtec and Flonase.  He takes Zyrtec once a day and Flonase twice a day despite this being prescribed once a day.  States that around 1 or 2 in the afternoon his nose starts to get very dry and he notices hard crusting.  He still has the "sticky mucus" that he was complaining about.  Gala Murdoch this is been gone for over a year now.  Started with a viral URI and persistent runny nose since then.  States he does not want to take allergy medicines for the rest of his life and he is annoyed by the constant need to scratch or miss with his nose.  No fevers or chills.  Drainage is mostly clear.  Occasionally yellow.  No headaches.  No sore throat.  No itchy eyes.  He is cutting back on other environmental exposures.  ROS as above per HPI.    The following portions of the patient's history were reviewed and updated as appropriate: allergies, current medications, past medical history, family and social history, and problem list. Patient is a nonsmoker.    PMH reviewed.  No past medical history on file. Past Surgical History:  Procedure Laterality Date  . ANKLE SURGERY Left 2006   Tarsel Tunnel Release  . KNEE SURGERY  1990   Bilateral knee scope    Medications reviewed. Current Outpatient Medications  Medication Sig Dispense Refill  . amoxicillin-clavulanate (AUGMENTIN) 875-125 MG tablet Take 1 tablet by mouth 2 (two) times daily. 28 tablet 0  . atorvastatin (LIPITOR) 20 MG tablet Take 1 tablet (20 mg total) by mouth daily. 90 tablet 3  . cholecalciferol (VITAMIN D) 1000 units tablet Take 1,000 Units by mouth  daily.    . finasteride (PROPECIA) 1 MG tablet Take 1 mg by mouth daily.    . nitroGLYCERIN (NITRODUR - DOSED IN MG/24 HR) 0.2 mg/hr patch Use 1/4 patch daily to the affected area 30 patch 1   No current facility-administered medications for this visit.      Objective:   Physical Exam BP 104/66   Temp 97.9 F (36.6 C) (Oral)   Ht 6' (1.829 m)   Wt 171 lb 6.4 oz (77.7 kg)   SpO2 96%   BMI 23.25 kg/m  Gen:  Patient sitting on exam table, appears stated age in no acute distress Head: Normocephalic atraumatic Eyes: EOMI, PERRL, sclera and conjunctiva non-erythematous Nose:  Normal appearing currently.   Mouth: Mucosa membranes moist. Tonsils +2, nonenlarged, non-erythematous.  Imp/Plan: 1.  Chronic allergic rhinitis: -This does seem to be chronic allergic rhinitis. -His sinus CT was completed and negative. -He is taking Flonase more than prescribed and this is likely contributing to the problem by drying of his nose. -We are going to switch to Flonase since missed which is saline rather than alcohol based and hope this will help moisturize his nose.  He does state that getting out of the shower is when he feels the best. -If no real improvement will send back to allergist. -Unclear of other etiologies.

## 2017-04-28 ENCOUNTER — Encounter: Payer: Self-pay | Admitting: Family Medicine

## 2017-04-28 ENCOUNTER — Ambulatory Visit (INDEPENDENT_AMBULATORY_CARE_PROVIDER_SITE_OTHER): Payer: 59 | Admitting: Family Medicine

## 2017-04-28 ENCOUNTER — Other Ambulatory Visit: Payer: Self-pay

## 2017-04-28 VITALS — BP 112/62 | HR 67 | Temp 98.1°F | Ht 72.0 in | Wt 170.8 lb

## 2017-04-28 DIAGNOSIS — R0981 Nasal congestion: Secondary | ICD-10-CM | POA: Diagnosis not present

## 2017-04-28 DIAGNOSIS — J329 Chronic sinusitis, unspecified: Secondary | ICD-10-CM

## 2017-04-28 DIAGNOSIS — E7800 Pure hypercholesterolemia, unspecified: Secondary | ICD-10-CM

## 2017-04-28 DIAGNOSIS — E785 Hyperlipidemia, unspecified: Secondary | ICD-10-CM | POA: Diagnosis not present

## 2017-04-28 MED ORDER — MUPIROCIN CALCIUM 2 % NA OINT: TOPICAL_OINTMENT | NASAL | 0 refills | Status: DC

## 2017-04-28 NOTE — Assessment & Plan Note (Signed)
Versus allergic rhinitis I do not think MRSA is causing this but we will do Bactroban for the next 5 days to see if this improves things. -Did discuss options of nasal biopsy but we will first obtain some nonspecific inflammatory labs to see if this is even worth it. -Refer to allergist Dr. Neldon Mc for further testing and recommendations. -If he does continue to have issues we can try Singulair after he has been seen by the allergist. -Obtain CBC with differential to look for eosinophil rise.

## 2017-04-28 NOTE — Patient Instructions (Signed)
It was good to see you today.  We are checking your blood levels including cholesterol, eosinophils, basic markers of inflammation.  I will refer you to an allergist and they should contact you within the next week or so.  Use the Bactroban which will eradicate any MRSA.  Use this twice a day for the next 5 days.  Depending on what the allergist says Singulair might be the next best step to calm things down.  Come back and see me to check in after you see the allergist.

## 2017-04-28 NOTE — Assessment & Plan Note (Signed)
Checking lipid panel today. 

## 2017-04-28 NOTE — Progress Notes (Signed)
Subjective:    Casey Garcia is a 42 y.o. male who presents to [facility] today for nasal congestion:  1.  Nasal congestion:  Ongoing issue for patient.  Still with stickiness that he feels in his upper nasal passages most days.  He did talk to an allergist friend but he is known for quite some time.  He recommended the following options: Nasal biopsy to see if there is any inflammation, attempting Singulair, referral to an allergist, testing for MRSA.  Patient is tried sensimist without much ehlp. He moved out to [facility] for several days but did not notice any difference in the dry air although he reports being inside in air conditioning for some time.  He has attempted dust mite containers are on his pillow but not yet around his bed.  no fevers or chills.  No dry cough.  He has not noticed a difference despite use since missed or other versus nonuse of injured nasal corticosteroid.   ROS as above per HPI.    The following portions of the patient's history were reviewed and updated as appropriate: allergies, current medications, past medical history, family and social history, and problem list. Patient is a nonsmoker.    PMH reviewed.  No past medical history on file. Past Surgical History:  Procedure Laterality Date  . ANKLE SURGERY Left 2006   Tarsel Tunnel Release  . KNEE SURGERY  1990   Bilateral knee scope    Medications reviewed. Current Outpatient Medications  Medication Sig Dispense Refill  . amoxicillin-clavulanate (AUGMENTIN) 875-125 MG tablet Take 1 tablet by mouth 2 (two) times daily. 28 tablet 0  . atorvastatin (LIPITOR) 20 MG tablet Take 1 tablet (20 mg total) by mouth daily. 90 tablet 3  . cholecalciferol (VITAMIN D) 1000 units tablet Take 1,000 Units by mouth daily.    . finasteride (PROPECIA) 1 MG tablet Take 1 mg by mouth daily.    . nitroGLYCERIN (NITRODUR - DOSED IN MG/24 HR) 0.2 mg/hr patch Use 1/4 patch daily to the affected area 30 patch 1   No current  facility-administered medications for this visit.      Objective:   Physical Exam BP 112/62   Pulse 67   Temp 98.1 F (36.7 C) (Oral)   Ht 6' (1.829 m)   Wt 170 lb 12.8 oz (77.5 kg)   SpO2 98%   BMI 23.16 kg/m  Gen:  Alert, cooperative patient who appears stated age in no acute distress.  Vital signs reviewed. Head:  [place]/AT Eyes:  EOMI, PERRL.  Sclera and conjunctiva non-erythematous and non-icteric. Nose:  Nares patent Ears:  Canals clear BL.  TM's pearly gray BL without effusion or retraction Mouth:  MMM.  Tonsils +2 BL without erythema or edema noted Neck:  No lymphadenopathy noted Cardiac:  Regular rate and rhythm without murmur auscultated.  Good S1/S2. Pulm:  Clear to auscultation bilaterally with good air movement.  No wheezes or rales noted.     No results found for this or any previous visit (from the past 72 hour(s)).

## 2017-04-29 ENCOUNTER — Other Ambulatory Visit: Payer: Self-pay | Admitting: Family Medicine

## 2017-04-29 LAB — COMPREHENSIVE METABOLIC PANEL
A/G RATIO: 2.2 (ref 1.2–2.2)
ALBUMIN: 4.8 g/dL (ref 3.5–5.5)
ALK PHOS: 63 IU/L (ref 39–117)
ALT: 41 IU/L (ref 0–44)
AST: 24 IU/L (ref 0–40)
BUN / CREAT RATIO: 16 (ref 9–20)
BUN: 13 mg/dL (ref 6–24)
Bilirubin Total: 0.5 mg/dL (ref 0.0–1.2)
CO2: 23 mmol/L (ref 20–29)
CREATININE: 0.83 mg/dL (ref 0.76–1.27)
Calcium: 9.5 mg/dL (ref 8.7–10.2)
Chloride: 103 mmol/L (ref 96–106)
GFR calc Af Amer: 127 mL/min/{1.73_m2} (ref >59)
GFR calc non Af Amer: 110 mL/min/{1.73_m2} (ref >59)
GLOBULIN, TOTAL: 2.2 g/dL (ref 1.5–4.5)
Glucose: 88 mg/dL (ref 65–99)
POTASSIUM: 4 mmol/L (ref 3.5–5.2)
SODIUM: 140 mmol/L (ref 134–144)
Total Protein: 7 g/dL (ref 6.0–8.5)

## 2017-04-29 LAB — CBC WITH DIFFERENTIAL/PLATELET
Basophils Absolute: 0 10*3/uL (ref 0.0–0.2)
Basos: 1 %
EOS (ABSOLUTE): 0 10*3/uL (ref 0.0–0.4)
EOS: 0 %
HEMATOCRIT: 42.8 % (ref 37.5–51.0)
Hemoglobin: 14.9 g/dL (ref 13.0–17.7)
Immature Grans (Abs): 0 10*3/uL (ref 0.0–0.1)
Immature Granulocytes: 0 %
LYMPHS ABS: 1.4 10*3/uL (ref 0.7–3.1)
Lymphs: 39 %
MCH: 31.1 pg (ref 26.6–33.0)
MCHC: 34.8 g/dL (ref 31.5–35.7)
MCV: 89 fL (ref 79–97)
MONOS ABS: 0.3 10*3/uL (ref 0.1–0.9)
Monocytes: 7 %
NEUTROS PCT: 53 %
Neutrophils Absolute: 1.9 10*3/uL (ref 1.4–7.0)
PLATELETS: 138 10*3/uL — AB (ref 150–379)
RBC: 4.79 x10E6/uL (ref 4.14–5.80)
RDW: 13.2 % (ref 12.3–15.4)
WBC: 3.6 10*3/uL (ref 3.4–10.8)

## 2017-04-29 LAB — LIPID PANEL
Chol/HDL Ratio: 3.1 ratio (ref 0.0–5.0)
Cholesterol, Total: 165 mg/dL (ref 100–199)
HDL: 54 mg/dL (ref >39)
LDL Calculated: 95 mg/dL (ref 0–99)
Triglycerides: 81 mg/dL (ref 0–149)
VLDL Cholesterol Cal: 16 mg/dL (ref 5–40)

## 2017-04-29 LAB — SEDIMENTATION RATE: SED RATE: 3 mm/h (ref 0–15)

## 2017-04-29 LAB — C-REACTIVE PROTEIN: CRP: 0.3 mg/L (ref 0.0–4.9)

## 2017-05-01 ENCOUNTER — Other Ambulatory Visit: Payer: Self-pay

## 2017-05-01 MED ORDER — MUPIROCIN CALCIUM 2 % NA OINT: TOPICAL_OINTMENT | NASAL | 0 refills | Status: DC

## 2017-05-01 NOTE — Addendum Note (Signed)
Addended byMingo Amber, Kayleen Memos on: 05/01/2017 01:43 PM   Modules accepted: Orders

## 2017-05-02 MED ORDER — MUPIROCIN CALCIUM 2 % NA OINT: TOPICAL_OINTMENT | NASAL | 0 refills | Status: DC

## 2017-08-22 ENCOUNTER — Ambulatory Visit: Payer: Self-pay | Admitting: Nurse Practitioner

## 2017-08-22 VITALS — BP 112/74 | HR 67 | Temp 98.9°F | Resp 18 | Wt 171.2 lb

## 2017-08-22 DIAGNOSIS — R05 Cough: Secondary | ICD-10-CM

## 2017-08-22 DIAGNOSIS — R059 Cough, unspecified: Secondary | ICD-10-CM

## 2017-08-22 DIAGNOSIS — J309 Allergic rhinitis, unspecified: Secondary | ICD-10-CM

## 2017-08-22 DIAGNOSIS — R0982 Postnasal drip: Secondary | ICD-10-CM

## 2017-08-22 MED ORDER — BENZONATATE 100 MG PO CAPS: 200.0000 mg | ORAL_CAPSULE | Freq: Three times a day (TID) | ORAL | 0 refills | Status: AC | PRN

## 2017-08-22 MED ORDER — AMOXICILLIN 875 MG PO TABS: 875.0000 mg | ORAL_TABLET | Freq: Two times a day (BID) | ORAL | 0 refills | Status: AC

## 2017-08-22 MED ORDER — MONTELUKAST SODIUM 10 MG PO TABS: 10.0000 mg | ORAL_TABLET | Freq: Every day | ORAL | 0 refills | Status: DC

## 2017-08-22 NOTE — Patient Instructions (Signed)
Allergic Rhinitis, Adult -Take medications as prescribed. -Ibuprofen 800mg  every 8 hours for 3 days for throat pain, may stop sooner if symptoms improve.  Take with food and water. -Have prescription filled for Amoxicillin if symptoms do not improve in the next 5-7 days or if symptoms suddenly worsen with increasing fever, chills, worsening cough with sputum production. -Follow up as needed. Allergic rhinitis is an allergic reaction that affects the mucous membrane inside the nose. It causes sneezing, a runny or stuffy nose, and the feeling of mucus going down the back of the throat (postnasal drip). Allergic rhinitis can be mild to severe. There are two types of allergic rhinitis:  Seasonal. This type is also called hay fever. It happens only during certain seasons.  Perennial. This type can happen at any time of the year.  What are the causes? This condition happens when the body's defense system (immune system) responds to certain harmless substances called allergens as though they were germs.  Seasonal allergic rhinitis is triggered by pollen, which can come from grasses, trees, and weeds. Perennial allergic rhinitis may be caused by:  House dust mites.  Pet dander.  Mold spores.  What are the signs or symptoms? Symptoms of this condition include:  Sneezing.  Runny or stuffy nose (nasal congestion).  Postnasal drip.  Itchy nose.  Tearing of the eyes.  Trouble sleeping.  Daytime sleepiness.  How is this diagnosed? This condition may be diagnosed based on:  Your medical history.  A physical exam.  Tests to check for related conditions, such as: ? Asthma. ? Pink eye. ? Ear infection. ? Upper respiratory infection.  Tests to find out which allergens trigger your symptoms. These may include skin or blood tests.  How is this treated? There is no cure for this condition, but treatment can help control symptoms. Treatment may include:  Taking medicines that block  allergy symptoms, such as antihistamines. Medicine may be given as a shot, nasal spray, or pill.  Avoiding the allergen.  Desensitization. This treatment involves getting ongoing shots until your body becomes less sensitive to the allergen. This treatment may be done if other treatments do not help.  If taking medicine and avoiding the allergen does not work, new, stronger medicines may be prescribed.  Follow these instructions at home:  Find out what you are allergic to. Common allergens include smoke, dust, and pollen.  Avoid the things you are allergic to. These are some things you can do to help avoid allergens: ? Replace carpet with wood, tile, or vinyl flooring. Carpet can trap dander and dust. ? Do not smoke. Do not allow smoking in your home. ? Change your heating and air conditioning filter at least once a month. ? During allergy season:  Keep windows closed as much as possible.  Plan outdoor activities when pollen counts are lowest. This is usually during the evening hours.  When coming indoors, change clothing and shower before sitting on furniture or bedding.  Take over-the-counter and prescription medicines only as told by your health care provider.  Keep all follow-up visits as told by your health care provider. This is important. Contact a health care provider if:  You have a fever.  You develop a persistent cough.  You make whistling sounds when you breathe (you wheeze).  Your symptoms interfere with your normal daily activities. Get help right away if:  You have shortness of breath. Summary  This condition can be managed by taking medicines as directed and avoiding allergens.  Contact your health care provider if you develop a persistent cough or fever.  During allergy season, keep windows closed as much as possible. This information is not intended to replace advice given to you by your health care provider. Make sure you discuss any questions you have  with your health care provider. Document Released: 09/28/2000 Document Revised: 02/11/2016 Document Reviewed: 02/11/2016 Elsevier Interactive Patient Education  2018 [employer].  Cough, Adult Coughing is a reflex that clears your throat and your airways. Coughing helps to heal and protect your lungs. It is normal to cough occasionally, but a cough that happens with other symptoms or lasts a long time may be a sign of a condition that needs treatment. A cough may last only 2-3 weeks (acute), or it may last longer than 8 weeks (chronic). What are the causes? Coughing is commonly caused by:  Breathing in substances that irritate your lungs.  A viral or bacterial respiratory infection.  Allergies.  Asthma.  Postnasal drip.  Smoking.  Acid backing up from the stomach into the esophagus (gastroesophageal reflux).  Certain medicines.  Chronic lung problems, including COPD (or rarely, lung cancer).  Other medical conditions such as heart failure.  Follow these instructions at home: Pay attention to any changes in your symptoms. Take these actions to help with your discomfort:  Take medicines only as told by your health care provider. ? If you were prescribed an antibiotic medicine, take it as told by your health care provider. Do not stop taking the antibiotic even if you start to feel better. ? Talk with your health care provider before you take a cough suppressant medicine.  Drink enough fluid to keep your urine clear or pale yellow.  If the air is dry, use a cold steam vaporizer or humidifier in your bedroom or your home to help loosen secretions.  Avoid anything that causes you to cough at work or at home.  If your cough is worse at night, try sleeping in a semi-upright position.  Avoid cigarette smoke. If you smoke, quit smoking. If you need help quitting, ask your health care provider.  Avoid caffeine.  Avoid alcohol.  Rest as needed.  Contact a health care  provider if:  You have new symptoms.  You cough up pus.  Your cough does not get better after 2-3 weeks, or your cough gets worse.  You cannot control your cough with suppressant medicines and you are losing sleep.  You develop pain that is getting worse or pain that is not controlled with pain medicines.  You have a fever.  You have unexplained weight loss.  You have night sweats. Get help right away if:  You cough up blood.  You have difficulty breathing.  Your heartbeat is very fast. This information is not intended to replace advice given to you by your health care provider. Make sure you discuss any questions you have with your health care provider. Document Released: 07/02/2010 Document Revised: 06/11/2015 Document Reviewed: 03/12/2014 Elsevier Interactive Patient Education  2018 [place] Drip Postnasal drip is the feeling of mucus going down the back of your throat. Mucus is a slimy substance that moistens and cleans your nose and throat, as well as the air pockets in face bones near your forehead and cheeks (sinuses). Small amounts of mucus pass from your nose and sinuses down the back of your throat all the time. This is normal. When you produce too much mucus or the mucus gets too thick, you  can feel it. Some common causes of postnasal drip include:  Having more mucus because of: ? A cold or the flu. ? Allergies. ? Cold air. ? Certain medicines.  Having more mucus that is thicker because of: ? A sinus or nasal infection. ? Dry air. ? A food allergy.  Follow these instructions at home: Relieving discomfort  Gargle with a salt-water mixture 3-4 times a day or as needed. To make a salt-water mixture, completely dissolve -1 tsp of salt in 1 cup of warm water.  If the air in your home is dry, use a humidifier to add moisture to the air.  Use a saline spray or container (neti pot) to flush out the nose (nasal irrigation). These methods can help  clear away mucus and keep the nasal passages moist. General instructions  Take over-the-counter and prescription medicines only as told by your health care provider.  Follow instructions from your health care provider about eating or drinking restrictions. You may need to avoid caffeine.  Avoid things that you know you are allergic to (allergens), like dust, mold, pollen, pets, or certain foods.  Drink enough fluid to keep your urine pale yellow.  Keep all follow-up visits as told by your health care provider. This is important. Contact a health care provider if:  You have a fever.  You have a sore throat.  You have difficulty swallowing.  You have headache.  You have sinus pain.  You have a cough that does not go away.  The mucus from your nose becomes thick and is green or yellow in color.  You have cold or flu symptoms that last more than 10 days. Summary  Postnasal drip is the feeling of mucus going down the back of your throat.  If your health care provider approves, use nasal irrigation or a nasal spray 2?4 times a day.  Avoid things that you know you are allergic to (allergens), like dust, mold, pollen, pets, or certain foods. This information is not intended to replace advice given to you by your health care provider. Make sure you discuss any questions you have with your health care provider. Document Released: 04/18/2016 Document Revised: 04/18/2016 Document Reviewed: 04/18/2016 Elsevier Interactive Patient Education  [employer].

## 2017-08-22 NOTE — Progress Notes (Signed)
Subjective:  Casey Garcia is a 42 y.o. male who presents for evaluation of URI like symptoms.  Symptoms include coryza, cough described as nonproductive, pain while swallowing and post nasal drip.  Onset of symptoms was 3 days ago, and has been gradually worsening since that time.  Treatment to date:  cough drops.  High risk factors for influenza complications:  co-morbid illness and Patient does have a history of heart disease..  The following portions of the patient's history were reviewed and updated as appropriate:  allergies, current medications and past medical history.  Constitutional: positive for fatigue, negative for anorexia, chills, fevers and sweats Eyes: negative Ears, nose, mouth, throat, and face: positive for sore throat and coryza and postnasal drip, negative for ear drainage, earaches and nasal congestion Respiratory: positive for cough, negative for asthma, chronic bronchitis, dyspnea on exertion, sputum, stridor and wheezing Cardiovascular: negative Gastrointestinal: negative Neurological: negative Allergic/Immunologic: positive for hay fever Objective:  BP 112/74 (BP Location: Right Arm, Patient Position: Sitting, Cuff Size: Normal)   Pulse 67   Temp 98.9 F (37.2 C) (Oral)   Resp 18   Wt 171 lb 3.2 oz (77.7 kg)   SpO2 96%   BMI 23.22 kg/m  General appearance: alert, cooperative and no distress Head: Normocephalic, without obvious abnormality, atraumatic Eyes: conjunctivae/corneas clear. PERRL, EOM's intact. Fundi benign. Ears: normal TM's and external ear canals both ears Nose: clear discharge, turbinates swollen, inflamed, no sinus tenderness Throat: abnormal findings: moderate oropharyngeal erythema and no exudates, +1 tonsil swelling bilaterally Lungs: clear to auscultation bilaterally Heart: regular rate and rhythm, S1, S2 normal, no murmur, click, rub or gallop Abdomen: soft, non-tender; bowel sounds normal; no masses,  no organomegaly Pulses: 2+ and  symmetric Skin: Skin color, texture, turgor normal. No rashes or lesions Lymph nodes: cervical and submandibular nodes normal Neurologic: Grossly normal    Assessment:  allergic rhinitis and viral upper respiratory illness    Plan:  Exam findings, diagnosis etiology and medication use and indications reviewed with patient. Follow- Up and discharge instructions provided. No emergent/urgent issues found on exam.  Patient verbalized understanding of information provided and agrees with plan of care (POC), all questions answered.  1. Cough  - amoxicillin (AMOXIL) 875 MG tablet; Take 1 tablet (875 mg total) by mouth 2 (two) times daily for 10 days.  Dispense: 20 tablet; Refill: 0 -Take medications as prescribed. -Ibuprofen 800mg  every 8 hours for 3 days for throat pain, may stop sooner if symptoms improve.  Take with food and water. -Have prescription filled for Amoxicillin if symptoms do not improve in the next 5-7 days or if symptoms suddenly worsen with increasing fever, chills, worsening cough with sputum production. -Follow up as needed.  2. Allergic rhinitis with postnasal drip  - montelukast (SINGULAIR) 10 MG tablet; Take 1 tablet (10 mg total) by mouth at bedtime.  Dispense: 30 tablet; Refill: 0 - benzonatate (TESSALON PERLES) 100 MG capsule; Take 2 capsules (200 mg total) by mouth 3 (three) times daily as needed for up to 10 days for cough.  Dispense: 30 capsule; Refill: 0 -Take medications as prescribed. -Ibuprofen 800mg  every 8 hours for 3 days for throat pain, may stop sooner if symptoms improve.  Take with food and water. -Have prescription filled for Amoxicillin if symptoms do not improve in the next 5-7 days or if symptoms suddenly worsen with increasing fever, chills, worsening cough with sputum production. -Follow up as needed.

## 2017-09-18 ENCOUNTER — Other Ambulatory Visit: Payer: Self-pay | Admitting: Nurse Practitioner

## 2017-09-18 DIAGNOSIS — R0982 Postnasal drip: Principal | ICD-10-CM

## 2017-09-18 DIAGNOSIS — J309 Allergic rhinitis, unspecified: Secondary | ICD-10-CM

## 2017-10-06 ENCOUNTER — Other Ambulatory Visit: Payer: Self-pay | Admitting: *Deleted

## 2017-10-06 DIAGNOSIS — E7800 Pure hypercholesterolemia, unspecified: Secondary | ICD-10-CM

## 2017-10-06 MED ORDER — ATORVASTATIN CALCIUM 20 MG PO TABS: 20.0000 mg | ORAL_TABLET | Freq: Every day | ORAL | 0 refills | Status: DC

## 2017-10-14 ENCOUNTER — Other Ambulatory Visit: Payer: Self-pay | Admitting: Nurse Practitioner

## 2017-10-14 DIAGNOSIS — R0982 Postnasal drip: Principal | ICD-10-CM

## 2017-10-14 DIAGNOSIS — J309 Allergic rhinitis, unspecified: Secondary | ICD-10-CM

## 2017-11-16 ENCOUNTER — Ambulatory Visit: Payer: 59 | Admitting: Sports Medicine

## 2017-11-23 ENCOUNTER — Ambulatory Visit: Payer: 59 | Admitting: Sports Medicine

## 2017-11-23 ENCOUNTER — Encounter: Payer: Self-pay | Admitting: Sports Medicine

## 2017-11-23 VITALS — BP 115/72 | Ht 72.0 in | Wt 165.0 lb

## 2017-11-23 DIAGNOSIS — M25551 Pain in right hip: Secondary | ICD-10-CM

## 2017-11-23 NOTE — Progress Notes (Addendum)
  Casey Garcia - 42 y.o. male MRN [id number]  Date of birth: 1975-10-08    SUBJECTIVE:      Chief Complaint:/ HPI:  42 year old male presents with right hip pain.  His hip pain has been present for nearly the past 2 years.  He was previously seen here in the clinic for this back in February 2018.  He was started in physical therapy at that time but has not noticed any improvement.  Since that time, he has lived with the pain and limit his activities accordingly.  He notices increased pain with running, and sprinting in particular.  He has not attempted to play soccer due to the pain.  He also notes that if he flexes at the hip and abducts, he can reproduce a popping sensation and audible "clunk".  Otherwise, he has not been using medications regularly.  He denies any radiation of his pain.  He denies any bruising or overlying erythema.  No associated skin changes   ROS:     See HPI  PERTINENT  PMH / PSH FH / / SH:  Past Medical, Surgical, Social, and Family History Reviewed & Updated in the EMR.     OBJECTIVE: BP 115/72   Ht 6' (1.829 m)   Wt 165 lb (74.8 kg)   BMI 22.38 kg/m   Physical Exam:  Vital signs are reviewed.  GEN: Alert and oriented, NAD Pulm: Breathing unlabored PSY: normal mood, congruent affect  MSK: Right hip:  - Inspection: No gross deformity, no swelling, erythema, or ecchymosis - Palpation: No TTP, specifically none over greater trochanter - ROM: Normal range of motion on Flexion, extension, abduction, internal and external rotation - Strength: 5/5 strength in hip flexion, abduction, and extension - Neuro/vasc: NV intact distally - Special Tests: Negative FABER pain with FADIR.    Left hip: No obvious deformity No tenderness to palpation Full range of motion with 5/5 strength N/V intact Pain with FADIR  ASSESSMENT & PLAN:  1.  Right hip pain secondary to femoral acetabular impingement and likely labral tear-x-rays from date of 2018 of the pelvis were  independently reviewed today.  He does have cam lesions of both femoral head neck junctions.  On imaging, the left is more pronounced than the right.  Although he is symptomatic on the right with activity, he does have discomfort with FADIR on the left during exam - We will order an MRI of the right hip to further evaluate -he will follow-up after MRI is complete to review the results.  We will further discuss at that time whether he would like to pursue surgery versus steroid injection versus neither.  Addendum:  MRI reviewed and this is normal by radiologist and by my review.  This does not confirm CAM lesions or labral tear.  IF pain persists he might require an arthrogram.  I called and left message with patient to discuss.  Ila Mcgill, MD

## 2017-11-23 NOTE — Patient Instructions (Signed)
Your hip pain is likely due to femoral acetabular impingement (FAI) as well as a labral tear.  The MRI we ordered will help [place] further evaluate this.  We will have you follow-up after the MRI is completed.

## 2017-11-29 ENCOUNTER — Other Ambulatory Visit: Payer: Self-pay | Admitting: Family Medicine

## 2017-11-29 DIAGNOSIS — E7800 Pure hypercholesterolemia, unspecified: Secondary | ICD-10-CM

## 2017-12-09 ENCOUNTER — Other Ambulatory Visit: Payer: 59

## 2017-12-12 ENCOUNTER — Other Ambulatory Visit: Payer: Self-pay | Admitting: Sports Medicine

## 2017-12-20 ENCOUNTER — Ambulatory Visit
Admission: RE | Admit: 2017-12-20 | Discharge: 2017-12-20 | Disposition: A | Discharge status: Home / Self Care | Payer: 59 | Source: Ambulatory Visit | Attending: Sports Medicine | Admitting: Sports Medicine

## 2017-12-20 DIAGNOSIS — M25551 Pain in right hip: Secondary | ICD-10-CM

## 2018-01-24 ENCOUNTER — Ambulatory Visit: Payer: 59 | Admitting: Family Medicine

## 2018-01-24 ENCOUNTER — Other Ambulatory Visit: Payer: Self-pay

## 2018-01-24 ENCOUNTER — Encounter: Payer: Self-pay | Admitting: Family Medicine

## 2018-01-24 DIAGNOSIS — L659 Nonscarring hair loss, unspecified: Secondary | ICD-10-CM

## 2018-01-24 DIAGNOSIS — R143 Flatulence: Secondary | ICD-10-CM

## 2018-01-24 DIAGNOSIS — R0982 Postnasal drip: Secondary | ICD-10-CM

## 2018-01-24 DIAGNOSIS — J309 Allergic rhinitis, unspecified: Secondary | ICD-10-CM

## 2018-01-24 MED ORDER — FINASTERIDE 1 MG PO TABS: 1.0000 mg | ORAL_TABLET | Freq: Every day | ORAL | 2 refills | Status: DC

## 2018-01-24 MED ORDER — MONTELUKAST SODIUM 10 MG PO TABS: 10.0000 mg | ORAL_TABLET | Freq: Every day | ORAL | 6 refills | Status: DC

## 2018-01-24 NOTE — Patient Instructions (Signed)
Try cutting out some foods, especially any diary or beans.    Use the simethicone for gas relief.    Take the probiotics.  Do this for about 4 - 6 weeks.    If it's getting worse, call and let me know.    I've refilled your Singulair and finasteride.    Let me know about your toenail.

## 2018-01-24 NOTE — Progress Notes (Signed)
Subjective:    Casey Garcia is a 43 y.o. male who presents to [facility] today for nasal issues:  1.  Nasal issues: Was started on Singulair by allergist.  Felt this helped.  Only had 1 months worth and worsened after being off of it.  He also tried MRSA eradication with mupirocin.  This also helped.  He is unsure if it was secondary to the antibacterial or the cream itself.  No runny nose.  No itchy eyes.  Just itchy nose.  No sinus congestion.  No fevers or chills.  2.  Flatulence:  New for the past year.  Describes feeling "gassy" throughout the day.  No particular meals.  He has tried backing off of dairy but has not completely cut this out.  He has not tried probiotics.  No actual abdominal pain.  No diarrhea.  No change in his stool.  Normal bowel movement once daily.  No nausea or vomiting.  3.  Toenail fungus: Left great toe.  He has tried Lamisil for just the past several days and is noticed some improvement.  He believes his nail is starting to become ingrown.  He would like to know if there is any else he can do.  No injuries.  No other onychomycosis.  Also wants refill for finasteride for hair loss.    ROS as above per HPI.     The following portions of the patient's history were reviewed and updated as appropriate: allergies, current medications, past medical history, family and social history, and problem list. Patient is a nonsmoker.    PMH reviewed.  No past medical history on file. Past Surgical History:  Procedure Laterality Date  . ANKLE SURGERY Left 2006   Tarsel Tunnel Release  . KNEE SURGERY  1990   Bilateral knee scope    Medications reviewed. Current Outpatient Medications  Medication Sig Dispense Refill  . amoxicillin-clavulanate (AUGMENTIN) 875-125 MG tablet Take 1 tablet by mouth 2 (two) times daily. (Patient not taking: Reported on 08/22/2017) 28 tablet 0  . atorvastatin (LIPITOR) 20 MG tablet TAKE 1 TABLET BY MOUTH  DAILY 90 tablet 1  . cholecalciferol (VITAMIN  D) 1000 units tablet Take 1,000 Units by mouth daily.    . finasteride (PROPECIA) 1 MG tablet Take 1 mg by mouth daily.    . montelukast (SINGULAIR) 10 MG tablet Take 1 tablet (10 mg total) by mouth at bedtime. 30 tablet 0  . mupirocin nasal ointment (BACTROBAN NASAL) 2 % Apply thin strip to each nostril twice daily After application, press sides of nose together and massage.  Please dispense regular mupirocin (Patient not taking: Reported on 08/22/2017) 10 g 0  . nitroGLYCERIN (NITRODUR - DOSED IN MG/24 HR) 0.2 mg/hr patch Use 1/4 patch daily to the affected area (Patient not taking: Reported on 08/22/2017) 30 patch 1   No current facility-administered medications for this visit.      Objective:   Physical Exam BP 110/70   Pulse 72   Temp 98.3 F (36.8 C) (Oral)   Ht 6' (1.829 m)   Wt 169 lb 6.4 oz (76.8 kg)   SpO2 98%   BMI 22.97 kg/m  Gen:  Alert, cooperative patient who appears stated age in no acute distress.  Vital signs reviewed. HEENT: EOMI,  MMM Cardiac:  Regular rate and rhythm without murmur auscultated.  Good S1/S2. Pulm:  Clear to auscultation bilaterally with good air movement.  No wheezes or rales noted.   Abd:  Soft/nondistended/nontender.  Good  bowel sounds throughout all four quadrants.  No masses noted.  Left foot: With very minimal onychomycosis noted medial aspect of left great toe.  No other toes.  Not in between toes

## 2018-01-26 ENCOUNTER — Encounter: Payer: Self-pay | Admitting: Family Medicine

## 2018-01-26 DIAGNOSIS — R143 Flatulence: Secondary | ICD-10-CM | POA: Insufficient documentation

## 2018-01-26 DIAGNOSIS — J309 Allergic rhinitis, unspecified: Secondary | ICD-10-CM | POA: Insufficient documentation

## 2018-01-26 NOTE — Assessment & Plan Note (Signed)
Refill for finasteride today.

## 2018-01-26 NOTE — Assessment & Plan Note (Signed)
Believe this is the issue. For especially since he had improvement with Singulair. -He can use coconut oil or some other emollient as lubrication for his nose as he felt this helped. -I have given a refill for Singulair.

## 2018-02-05 DIAGNOSIS — D485 Neoplasm of uncertain behavior of skin: Secondary | ICD-10-CM | POA: Diagnosis not present

## 2018-02-05 DIAGNOSIS — D1801 Hemangioma of skin and subcutaneous tissue: Secondary | ICD-10-CM | POA: Diagnosis not present

## 2018-03-18 ENCOUNTER — Encounter: Payer: Self-pay | Admitting: Family Medicine

## 2018-03-18 DIAGNOSIS — R140 Abdominal distension (gaseous): Secondary | ICD-10-CM

## 2018-04-24 ENCOUNTER — Encounter: Payer: Self-pay | Admitting: Family Medicine

## 2018-04-26 ENCOUNTER — Telehealth (INDEPENDENT_AMBULATORY_CARE_PROVIDER_SITE_OTHER): Payer: 59 | Admitting: Family Medicine

## 2018-04-26 DIAGNOSIS — R1013 Epigastric pain: Secondary | ICD-10-CM

## 2018-04-26 DIAGNOSIS — R079 Chest pain, unspecified: Secondary | ICD-10-CM | POA: Insufficient documentation

## 2018-04-26 DIAGNOSIS — R0789 Other chest pain: Secondary | ICD-10-CM

## 2018-04-26 NOTE — Assessment & Plan Note (Signed)
Likely combo of musculoskeletal and GERD

## 2018-04-26 NOTE — Assessment & Plan Note (Addendum)
Likely GERD versus gastritis.  Increase omeprazole.  Keep video GI appointment.  Avoid alcohol and NSAIDs   Likely contributing to chest pain.

## 2018-04-26 NOTE — Telephone Encounter (Signed)
Called: Agrees to phone visit. Several issues: 1. Indigestion.  Sounds dyspepsic.  Has been on low dose omeprazole for 2 weeks without benefit.  Hx of gastritis.  No vomiting.  No stool changes.  Has a video visit set up with GI in 4 days. 2. Chest discomfort.  Seems to have two components.  One is a constant dull discomfort in lower chest which he relates to his indigestion.  Second is an occasional sharp pain "like a knife stabbing."  Also complains of tension in his shoulders. 3. Had a rare migraine yesterday.  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  Still out in public.  Worried about his business, his employees and his exposure.  4. Some leg aches.  Wonders if this is a sx of COVID.  Only respiratory sx is rhinitis x 6 months.  No acute worsening.  No cough, fever, or shortness of breath.  Very low risk for COVID  Duration of visit was 15 minutes.

## 2018-04-30 ENCOUNTER — Other Ambulatory Visit: Payer: Self-pay

## 2018-04-30 ENCOUNTER — Ambulatory Visit (INDEPENDENT_AMBULATORY_CARE_PROVIDER_SITE_OTHER): Payer: 59 | Admitting: Internal Medicine

## 2018-04-30 ENCOUNTER — Encounter: Payer: Self-pay | Admitting: Internal Medicine

## 2018-04-30 VITALS — Ht 72.0 in | Wt 165.0 lb

## 2018-04-30 DIAGNOSIS — R140 Abdominal distension (gaseous): Secondary | ICD-10-CM

## 2018-04-30 DIAGNOSIS — R143 Flatulence: Secondary | ICD-10-CM | POA: Diagnosis not present

## 2018-04-30 DIAGNOSIS — K219 Gastro-esophageal reflux disease without esophagitis: Secondary | ICD-10-CM

## 2018-04-30 DIAGNOSIS — R0789 Other chest pain: Secondary | ICD-10-CM | POA: Diagnosis not present

## 2018-04-30 NOTE — Patient Instructions (Addendum)
   Good to meet you via telehealth today.  Here are my recommendations to evaluate and treat you, as we discused.  1) Continue taking the omeprazole twice a day 2) take the samples of IB Gard 2 before meals and possibly at bedtime - if that helps purchase and continue (for the gas) 3) Please perform the Small intestinal bacterial overgrowth test (SIBO test) - when you mail it in the company will notify me of the results and I will contact you 4) Consider keeping a food diary and tryng to relate symptoms 5) see you at your follow-up in May (about 6 weeks) and call us sooner or send My Chart message as needed  I appreciate the opportunity to care for you.  Gatha Mayer, MD, [facility]

## 2018-04-30 NOTE — Progress Notes (Signed)
TELEHEALTH ENCOUNTER IN SETTING OF COVID-19 PANDEMIC - REQUESTED BY PATIENT SERVICE PROVIDED BY TELEMEDECINE - TYPE: Failed audio video - power outage - telephone used PATIENT LOCATION: Home/work PATIENT HAS CONSENTED TO TELEHEALTH VISIT PROVIDER LOCATION: OFFICE REFERRING PROVIDER:Walden, Casey Memos, MD [place], [place] [address]  TIME SPENT ON CALL/VISIT: 61 mins    Casey Garcia 43 y.o. 02/16/1975 [id number]  Assessment & Plan:   Encounter Diagnoses  Name Primary?  . Atypical chest pain Yes  . Gastroesophageal reflux disease, esophagitis presence not specified - suspected   . Flatulence   . Bloating    It sounds to me like he probably has GERD as a cause of his atypical chest pain.  That is getting better on omeprazole and hopefully will continue.  He can take over-the-counter twice daily.  Regarding the flatulence and bloating, question small intestinal bacterial overgrowth.  Question IBS.  Does not seem to be dietary necessarily.  Samples of IBgard will be provided to the patient and he will do a lactulose hydrogen breath test.  Looking for small intestinal bacterial overgrowth.  Further plans pending these results.  Consider FODMAPs diet change through modify health.  Follow-up in late May about 6 weeks from now   I appreciate the opportunity to care for this patient. CC: Casey Reasons, MD  Subjective:   Chief Complaint: chest pain and gas  HPI The patient is a 43 year old single white man who has 2 main complaints, one is that of excessive flatulence since that time 2019 and also chest pain.  As far as the gas sometime around Thanksgiving he noted increased flatulence.  It is become an issue, and happens most of the day and sometimes at night.  It socially a problem, especially in the workplace he says.  He is never really had problems like that before.  He has had a history of a gastritis diagnosed by EGD in [place] number of  years ago but he did not have problems like this.  He has tried a bit of a probiotic at the recommendation of Dr. Rosalyn Gess, and not sure that is helping it.  He has tried Gas-X without help.  No antibiotics change in diet, other than some topical mupirocin for nasopharyngeal colonization or infection.  I think he might of been suspected to have MRSA.  Bowel habits are regular without diarrhea constipation or bleeding.  He tends to eat out quite a bit.  On the weekends he will drink several beers or more.  None of this seems to affect it necessarily as she seems to be there.  No unintentional weight loss reported.  As far as chest pain there is some intermittent heartburn symptomatology over the years.  More recently in the last couple weeks or so he said sharp stabbing chest pains more on the left side, intermittent.  He was started on over-the-counter omeprazole daily with some benefit and increased it to twice a day and is noted improvement though not resolution since doing that in the past couple of weeks.  No dysphagia no unintentional weight loss.  Those pains are not nocturnal.  They are not exertional at all.  His father did have a coronary bypass grafting and stents in his 76s but there is no premature disease though the patient does himself have hyperlipidemia I believe.  He does not have any exertional dyspnea no fever or chills. No Known Allergies Current Meds  Medication Sig  . atorvastatin (  LIPITOR) 20 MG tablet TAKE 1 TABLET BY MOUTH  DAILY  . calcium carbonate (TUMS - DOSED IN MG ELEMENTAL CALCIUM) 500 MG chewable tablet Chew 1 tablet by mouth as needed for indigestion or heartburn.  . finasteride (PROPECIA) 1 MG tablet Take 1 tablet (1 mg total) by mouth daily.  Marland Kitchen omeprazole (PRILOSEC) 20 MG capsule Take 20 mg by mouth daily.   Past Medical History:  Diagnosis Date  . Allergic rhinitis   . DDD (degenerative disc disease), cervical   . Plantar fascia syndrome   . Pure  hypercholesterolemia    Past Surgical History:  Procedure Laterality Date  . ANKLE SURGERY Left 2006   Tarsel Tunnel Release  . ESOPHAGOGASTRODUODENOSCOPY     Early 2000's, gastritis, [place]  . KNEE SURGERY  1990   Bilateral knee scope   Social History   Social History Narrative   [place], mostly apartments (historic rehabilitation)   Not married, no kids   Grew up in [place] on the weekends up to 5 or 10 in a time   No/never tobacco and no drug use   family history includes Coronary artery disease in his father.   Review of Systems All other review of systems negative except as per HPI

## 2018-05-04 ENCOUNTER — Telehealth: Payer: 59 | Admitting: Internal Medicine

## 2018-05-05 DIAGNOSIS — R143 Flatulence: Secondary | ICD-10-CM | POA: Diagnosis not present

## 2018-05-05 DIAGNOSIS — R140 Abdominal distension (gaseous): Secondary | ICD-10-CM | POA: Diagnosis not present

## 2018-05-14 ENCOUNTER — Telehealth: Payer: Self-pay | Admitting: Internal Medicine

## 2018-05-14 NOTE — Telephone Encounter (Signed)
Patient reassured that we send an order for the hydrogen breath test as well.

## 2018-05-15 ENCOUNTER — Telehealth: Payer: Self-pay

## 2018-05-15 ENCOUNTER — Ambulatory Visit (HOSPITAL_COMMUNITY)
Admission: EM | Admit: 2018-05-15 | Discharge: 2018-05-15 | Disposition: A | Discharge status: Home / Self Care | Payer: 59 | Attending: Family Medicine | Admitting: Family Medicine

## 2018-05-15 ENCOUNTER — Encounter (HOSPITAL_COMMUNITY): Payer: Self-pay | Admitting: Emergency Medicine

## 2018-05-15 ENCOUNTER — Other Ambulatory Visit: Payer: Self-pay

## 2018-05-15 DIAGNOSIS — R0789 Other chest pain: Secondary | ICD-10-CM

## 2018-05-15 HISTORY — DX: Gastro-esophageal reflux disease without esophagitis: K21.9

## 2018-05-15 MED ORDER — ALUM & MAG HYDROXIDE-SIMETH 200-200-20 MG/5ML PO SUSP
30.0000 mL | Freq: Once | ORAL | Status: AC
  Administered 2018-05-15: 20:00:00 30 mL via ORAL

## 2018-05-15 MED ORDER — LIDOCAINE VISCOUS HCL 2 % MT SOLN
OROMUCOSAL | Status: AC
  Filled 2018-05-15: qty 15

## 2018-05-15 MED ORDER — LIDOCAINE VISCOUS HCL 2 % MT SOLN
15.0000 mL | Freq: Once | OROMUCOSAL | Status: AC
  Administered 2018-05-15: 15 mL via ORAL

## 2018-05-15 MED ORDER — ALUM & MAG HYDROXIDE-SIMETH 200-200-20 MG/5ML PO SUSP
ORAL | Status: AC
  Filled 2018-05-15: qty 30

## 2018-05-15 NOTE — Discharge Instructions (Addendum)
Please add Maalox to use as needed for chest discomfort  Follow up if symptoms changing or worsening

## 2018-05-15 NOTE — ED Triage Notes (Signed)
Pt sts CP x 3 weeks worse with inspiration and movement; pt sts some GERD sx

## 2018-05-15 NOTE — Telephone Encounter (Signed)
Please advise Sir, thank you. 

## 2018-05-16 MED ORDER — DEXLANSOPRAZOLE 60 MG PO CPDR: 60.0000 mg | DELAYED_RELEASE_CAPSULE | Freq: Every day | ORAL | 1 refills | Status: DC

## 2018-05-16 NOTE — Telephone Encounter (Signed)
Patient informed and has f/u appointment on 06/01/2018 at 4pm.

## 2018-05-16 NOTE — Telephone Encounter (Signed)
I sent in Dexilant 60 mg po qday (tell him to take 30 mins before breakfast)  Needs to stop omeprazole  Also let him know that his SIBO test was negative - he does not have small intestinal bacterial overgrowth.  He should be on the books for f/u as per last visit recs

## 2018-05-16 NOTE — ED Provider Notes (Signed)
[facility]    CSN: [id number] Arrival date & time: 05/15/18  1833     History   Chief Complaint Chief Complaint  Patient presents with  . Chest Pain    HPI Casey Garcia is a 43 y.o. male history of allergic rhinitis, GERD, hyperlipidemia, presenting today for evaluation of chest discomfort.  Patient states that for the past 3 weeks he has had chest pain.  He describes this discomfort as occasionally on the left side occasionally on the right side will often migrate to various areas.  States that he has a pressure sensation with occasional sharp pains.  Over the past week he feels his symptoms have been worse.  He also notes that he has had frequent burping.  He is also felt fatigued.  Denies any known fevers, chills or body aches.  He has had a very infrequent cough and only notes approximately 3-4 episodes of coughing and mainly at nighttime.  Denies rhinorrhea or sore throat.  Denies nausea or vomiting.  Denies any triggering or exacerbating factors.  Denies change in symptoms with position, movement, eating.  He did have an E visit with a gastroenterologist a few weeks ago and has been on omeprazole 20 mg twice daily consistently over the past couple weeks.  He is also been working on changing his diet.  He has not noticed any improvement in his symptoms with taking this medicine.  He notes that he has a history of gastritis.  Denies any diarrhea or changes in bowel movements.  Denies abdominal pain.  He does note that he has had associated neck back and shoulder stiffness.  Denies difficulty moving his neck or shoulders, but has had soreness since then.  Denies any injury, heavy lifting.  Denies any blows to the chest.  Denies history of hypertension, DM, smoking.  Denies previous DVT/PE.  Denies leg pain or leg swelling.  Denies recent travel or immobilization.  HPI  Past Medical History:  Diagnosis Date  . Allergic rhinitis   . DDD (degenerative disc disease), cervical    . GERD (gastroesophageal reflux disease)   . Plantar fascia syndrome   . Pure hypercholesterolemia     Patient Active Problem List   Diagnosis Date Noted  . Dyspepsia 04/26/2018  . Chest pain 04/26/2018  . Allergic rhinitis 01/26/2018  . Flatulence 01/26/2018  . Chronic sinusitis 08/12/2016  . Mild right atrial enlargement 07/24/2015  . Ventricular enlargement, right 07/24/2015  . ASD (atrial septal defect) 06/19/2015  . DDD (degenerative disc disease), cervical 06/19/2015  . Pure hypercholesterolemia 04/03/2015  . Fatigue 04/03/2015  . Hair loss 04/03/2015  . Plantar fascia syndrome 09/14/2010    Past Surgical History:  Procedure Laterality Date  . ANKLE SURGERY Left 2006   Tarsel Tunnel Release  . ESOPHAGOGASTRODUODENOSCOPY     Early 2000's, gastritis, [place]  . KNEE SURGERY  1990   Bilateral knee scope       Home Medications    Prior to Admission medications   Medication Sig Start Date End Date Taking? Authorizing Provider  atorvastatin (LIPITOR) 20 MG tablet TAKE 1 TABLET BY MOUTH  DAILY 11/29/17   [name], MD  calcium carbonate (TUMS - DOSED IN MG ELEMENTAL CALCIUM) 500 MG chewable tablet Chew 1 tablet by mouth as needed for indigestion or heartburn.    [provider]  dexlansoprazole (DEXILANT) 60 MG capsule Take 1 capsule (60 mg total) by mouth daily. 05/16/18   Gatha Mayer, MD  finasteride (  PROPECIA) 1 MG tablet Take 1 tablet (1 mg total) by mouth daily. 01/24/18   [name], MD  montelukast (SINGULAIR) 10 MG tablet Take 1 tablet (10 mg total) by mouth at bedtime. 01/24/18 02/23/18  [name], MD    Family History Family History  Problem Relation Age of Onset  . Coronary artery disease Father     Social History Social History   Tobacco Use  . Smoking status: Never Smoker  . Smokeless tobacco: Never Used  Substance Use Topics  . Alcohol use: Yes    Alcohol/week: 7.0 - 12.0 standard drinks    Types: 7 - 12 Standard  drinks or equivalent per week  . Drug use: No     Allergies   Patient has no known allergies.   Review of Systems Review of Systems  Constitutional: Negative for activity change, appetite change, chills, fatigue and fever.  HENT: Negative for congestion, ear pain, rhinorrhea, sinus pressure, sore throat and trouble swallowing.   Eyes: Negative for discharge and redness.  Respiratory: Positive for chest tightness. Negative for cough and shortness of breath.   Cardiovascular: Positive for chest pain. Negative for leg swelling.  Gastrointestinal: Negative for abdominal pain, diarrhea, nausea and vomiting.  Musculoskeletal: Negative for myalgias.  Skin: Negative for rash.  Neurological: Negative for dizziness, light-headedness and headaches.     Physical Exam Triage Vital Signs ED Triage Vitals [05/15/18 1846]  Enc Vitals Group     BP 136/62     Pulse Rate 68     Resp 18     Temp (!) 97 F (36.1 C)     Temp Source Temporal     SpO2 99 %     Weight      Height      Head Circumference      Peak Flow      Pain Score 4     Pain Loc      Pain Edu?      Excl. in [place]?    No data found.  Updated Vital Signs BP 136/62 (BP Location: Right Arm)   Pulse 68   Temp (!) 97 F (36.1 C) (Temporal)   Resp 18   SpO2 99%   Visual Acuity Right Eye Distance:   Left Eye Distance:   Bilateral Distance:    Right Eye Near:   Left Eye Near:    Bilateral Near:     Physical Exam Vitals signs and nursing note reviewed.  Constitutional:      Appearance: He is well-developed.  HENT:     Head: Normocephalic and atraumatic.     Mouth/Throat:     Comments: Oral mucosa pink and moist, no tonsillar enlargement or exudate. Posterior pharynx patent and nonerythematous, no uvula deviation or swelling. Normal phonation.  Eyes:     Conjunctiva/sclera: Conjunctivae normal.     Pupils: Pupils are equal, round, and reactive to light.     Comments: Wearing glasses  Neck:     Musculoskeletal:  Neck supple.  Cardiovascular:     Rate and Rhythm: Normal rate and regular rhythm.     Heart sounds: No murmur.  Pulmonary:     Effort: Pulmonary effort is normal. No respiratory distress.     Breath sounds: Normal breath sounds.     Comments: No anterior chest tenderness to palpation bilaterally  Breathing comfortably at rest, CTABL, no wheezing, rales or other adventitious sounds auscultated Chest:     Chest wall: No tenderness.  Abdominal:  Palpations: Abdomen is soft.     Tenderness: There is no abdominal tenderness.     Comments: Nontender to light deep palpation throughout all 4 quadrants of abdomen and epigastrium.  No rebound.  Musculoskeletal:     Comments: Bilateral lower extremities with out swelling erythema or calf tenderness, symmetric  Skin:    General: Skin is warm and dry.  Neurological:     Mental Status: He is alert.      UC Treatments / Results  Labs (all labs ordered are listed, but only abnormal results are displayed) Labs Reviewed - No data to display  EKG None  Radiology No results found.  Procedures Procedures (including critical care time)  Medications Ordered in UC Medications  alum & mag hydroxide-simeth (MAALOX/MYLANTA) 200-200-20 MG/5ML suspension 30 mL (30 mLs Oral Given 05/15/18 1945)    And  lidocaine (XYLOCAINE) 2 % viscous mouth solution 15 mL (15 mLs Oral Given 05/15/18 1946)    Initial Impression / Assessment and Plan / UC Course  I have reviewed the triage vital signs and the nursing notes.  Pertinent labs & imaging results that were available during my care of the patient were reviewed by me and considered in my medical decision making (see chart for details).    EKG normal sinus rhythm, incomplete right bundle branch block that was present on previous EKGs.  No acute signs of ischemia or infarction.  Discomfort not reproducible on exam.  Lungs clear.  No associated cough, do not suspect underlying pneumonia.  Negative risk  factors for DVT/PE.  Do not suspect underlying PE.  GI cocktail provided, reassessed after approximately 15 minutes.  Patient did have some improvement in the discomfort in his chest after taking this medicine.  Discussed that likely this discomfort is coming from GI tract.  Possible GERD versus gastritis versus contributing hiatal hernia.  Advised to continue omeprazole taking 40 daily, may add to supplement with Maalox as he responded to this.  Follow-up with gastroenterology as planned for further evaluation and management of discomfort.  Continue to monitor,Discussed strict return precautions. Patient verbalized understanding and is agreeable with plan.  Final Clinical Impressions(s) / UC Diagnoses   Final diagnoses:  Atypical chest pain     Discharge Instructions     Please add Maalox to use as needed for chest discomfort  Follow up if symptoms changing or worsening   ED Prescriptions    None     Controlled Substance Prescriptions [place] Controlled Substance Registry consulted? Not Applicable   Janith Lima, [place] 05/16/18 1406

## 2018-06-01 ENCOUNTER — Ambulatory Visit: Payer: 59 | Admitting: Internal Medicine

## 2018-06-04 ENCOUNTER — Other Ambulatory Visit: Payer: Self-pay

## 2018-06-04 ENCOUNTER — Ambulatory Visit (INDEPENDENT_AMBULATORY_CARE_PROVIDER_SITE_OTHER): Payer: 59 | Admitting: Internal Medicine

## 2018-06-04 DIAGNOSIS — R143 Flatulence: Secondary | ICD-10-CM

## 2018-06-04 DIAGNOSIS — R079 Chest pain, unspecified: Secondary | ICD-10-CM | POA: Diagnosis not present

## 2018-06-04 NOTE — Assessment & Plan Note (Signed)
Still an issue - monitoring Probiotic has not helped IB Donald Prose has not helped either

## 2018-06-04 NOTE — Progress Notes (Signed)
    TELEHEALTH ENCOUNTER IN SETTING OF COVID-19 PANDEMIC - REQUESTED BY PATIENT SERVICE PROVIDED BY TELEMEDECINE - TYPE: Zoom A/V  PATIENT LOCATION: Work PATIENT HAS CONSENTED TO TELEHEALTH VISIT PROVIDER LOCATION: OFFICE REFERRING PROVIDER: Dr. Mingo Amber PARTICIPANTS OTHER THAN PATIENT none TIME SPENT ON CALL: 12 minutes    Casey Garcia 42 y.o. [birth year] [id number]  Assessment & Plan:   Chest pain Improving on Dexilant Will continue that and prn maalox If not completely better after about 2 mos Dexilant likely EGD  Flatulence Still an issue - monitoring Probiotic has not helped IB Donald Prose has not helped either     Subjective:   Chief Complaint: Follow-up atypical chest pain  HPI Acid reflux and chest pain doing much better than on the omeprazole Less belching and burping Less heartburn and chest pain but not gone Also using Maalox at night perventive - and other times needs in late afternoon to treat heartburn Wt Readings from Last 3 Encounters:  04/30/18 165 lb (74.8 kg)  01/24/18 169 lb 6.4 oz (76.8 kg)  11/23/17 165 lb (74.8 kg)   158-9# now since stopping beer  No Known Allergies  Current Outpatient Medications:  .  atorvastatin (LIPITOR) 20 MG tablet, TAKE 1 TABLET BY MOUTH  DAILY, Disp: 90 tablet, Rfl: 1 .  calcium carbonate (TUMS - DOSED IN MG ELEMENTAL CALCIUM) 500 MG chewable tablet, Chew 1 tablet by mouth as needed for indigestion or heartburn., Disp: , Rfl:  .  dexlansoprazole (DEXILANT) 60 MG capsule, Take 1 capsule (60 mg total) by mouth daily., Disp: 60 capsule, Rfl: 1 .  finasteride (PROPECIA) 1 MG tablet, Take 1 tablet (1 mg total) by mouth daily., Disp: 90 tablet, Rfl: 2 .  montelukast (SINGULAIR) 10 MG tablet, Take 1 tablet (10 mg total) by mouth at bedtime., Disp: 30 tablet, Rfl: 6  Past Medical History:  Diagnosis Date  . Allergic rhinitis   . DDD (degenerative disc disease), cervical   . GERD (gastroesophageal reflux disease)   . Plantar  fascia syndrome   . Pure hypercholesterolemia    Past Surgical History:  Procedure Laterality Date  . ANKLE SURGERY Left 2006   Tarsel Tunnel Release  . ESOPHAGOGASTRODUODENOSCOPY     Early 2000's, gastritis, [place]  . KNEE SURGERY  1990   Bilateral knee scope   Social History   Social History Narrative   [place], mostly apartments (historic rehabilitation)   Not married, no kids   Grew up in [place] on the weekends up to 5 or 10 in a time   No/never tobacco and no drug use   family history includes Coronary artery disease in his father.   Review of Systems As per HPI

## 2018-06-04 NOTE — Assessment & Plan Note (Signed)
Improving on Dexilant Will continue that and prn maalox If not completely better after about 2 mos Dexilant likely EGD

## 2018-06-04 NOTE — Patient Instructions (Addendum)
Glad things are improved and hope the problems resolve.  You will have a 1 month follow-up appointment on 07/09/2018 at 4:00pm. IN PERSON VISIT  If you are not continuing to improve about 2-3 weeks from now we could change that appointment (not exact date) to an upper endoscopy instead.  I appreciate the opportunity to care for you. Gatha Mayer, MD, [facility]

## 2018-06-20 ENCOUNTER — Other Ambulatory Visit: Payer: Self-pay | Admitting: Family Medicine

## 2018-06-20 DIAGNOSIS — E7800 Pure hypercholesterolemia, unspecified: Secondary | ICD-10-CM

## 2018-07-06 ENCOUNTER — Telehealth: Payer: Self-pay | Admitting: General Surgery

## 2018-07-06 ENCOUNTER — Telehealth: Payer: Self-pay | Admitting: Internal Medicine

## 2018-07-06 NOTE — Telephone Encounter (Signed)
Please advise 

## 2018-07-06 NOTE — Telephone Encounter (Signed)
Pt reported that he is still having GERD symptoms and chest pain--pt would like to know whether to cancel upcoming OV and schedule EGD directly or to postpone OV until after EGD.

## 2018-07-06 NOTE — Telephone Encounter (Signed)
OK to set up an EGD instead of OV

## 2018-07-06 NOTE — Telephone Encounter (Signed)
Covid-19 screening questions   Do you now or have you had a fever in the last 14 days?NO  Do you have any respiratory symptoms of shortness of breath or cough now or in the last 14 days? no  Do you have any family members or close contacts with diagnosed or suspected Covid-19 in the past 14 days? No   Have you been tested for Covid-19 and found to be positive? NO   Patient instructed to wear a mask to appointment.

## 2018-07-09 ENCOUNTER — Ambulatory Visit: Payer: 59 | Admitting: Internal Medicine

## 2018-07-09 NOTE — Telephone Encounter (Signed)
Pt scheduled EGD July 11 at 9:30 AM.

## 2018-07-09 NOTE — Telephone Encounter (Signed)
Pt will need a previsit also for the EGD.

## 2018-07-09 NOTE — Telephone Encounter (Signed)
Please cancel OV and schedule EGD per Dr. Carlean Purl, see notes below.

## 2018-07-11 ENCOUNTER — Other Ambulatory Visit: Payer: Self-pay

## 2018-07-11 ENCOUNTER — Ambulatory Visit: Payer: 59 | Admitting: *Deleted

## 2018-07-11 VITALS — Ht 72.0 in | Wt 158.0 lb

## 2018-07-11 DIAGNOSIS — R079 Chest pain, unspecified: Secondary | ICD-10-CM

## 2018-07-11 DIAGNOSIS — R143 Flatulence: Secondary | ICD-10-CM

## 2018-07-11 NOTE — Progress Notes (Signed)
Previsit via telephone related to COVID19 ID per name,DOB and address  No egg or soy allergy known to patient  No issues with past sedation with any surgeries  or procedures, no intubation problems  No diet pills per patient No home 02 use per patient  No blood thinners per patient  Pt denies issues with constipation  No A fib or A flutter  EMMI information in the packet

## 2018-07-16 ENCOUNTER — Encounter: Payer: Self-pay | Admitting: Internal Medicine

## 2018-07-27 ENCOUNTER — Telehealth: Payer: Self-pay | Admitting: Internal Medicine

## 2018-07-27 NOTE — Telephone Encounter (Signed)

## 2018-07-28 ENCOUNTER — Other Ambulatory Visit: Payer: Self-pay

## 2018-07-28 ENCOUNTER — Ambulatory Visit (AMBULATORY_SURGERY_CENTER): Payer: 59 | Admitting: Internal Medicine

## 2018-07-28 ENCOUNTER — Encounter: Payer: Self-pay | Admitting: Internal Medicine

## 2018-07-28 VITALS — BP 113/70 | HR 57 | Temp 98.6°F | Resp 12 | Ht 72.0 in | Wt 165.0 lb

## 2018-07-28 DIAGNOSIS — R141 Gas pain: Secondary | ICD-10-CM

## 2018-07-28 DIAGNOSIS — R079 Chest pain, unspecified: Secondary | ICD-10-CM

## 2018-07-28 DIAGNOSIS — R12 Heartburn: Secondary | ICD-10-CM | POA: Diagnosis not present

## 2018-07-28 MED ORDER — SODIUM CHLORIDE 0.9 % IV SOLN: 500.0000 mL | Freq: Once | INTRAVENOUS | Status: DC

## 2018-07-28 NOTE — Op Note (Signed)
[place] Patient Name: Casey Garcia Procedure Date: 07/28/2018 9:36 AM MRN: [id number] Endoscopist: Gatha Mayer , MD Age: 43 Referring MD:  Date of Birth: [birth year] Gender: Male Account #: [id number] Procedure:                Upper GI endoscopy Indications:              Heartburn, Unexplained chest pain Medicines:                Propofol per Anesthesia, Monitored Anesthesia Care Procedure:                Pre-Anesthesia Assessment:                           - Prior to the procedure, a History and Physical                            was performed, and patient medications and                            allergies were reviewed. The patient's tolerance of                            previous anesthesia was also reviewed. The risks                            and benefits of the procedure and the sedation                            options and risks were discussed with the patient.                            All questions were answered, and informed consent                            was obtained. Prior Anticoagulants: The patient has                            taken no previous anticoagulant or antiplatelet                            agents. ASA Grade Assessment: II - A patient with                            mild systemic disease. After reviewing the risks                            and benefits, the patient was deemed in                            satisfactory condition to undergo the procedure.                           After obtaining informed consent, the endoscope was  passed under direct vision. Throughout the                            procedure, the patient's blood pressure, pulse, and                            oxygen saturations were monitored continuously. The                            Endoscope was introduced through the mouth, and                            advanced to the second part of duodenum. The upper                             GI endoscopy was accomplished without difficulty.                            The patient tolerated the procedure well. Scope In: Scope Out: Findings:                 Mucosal changes including longitudinal furrows and                            white plaques were found in the entire esophagus.                            Biopsies were obtained from the proximal and distal                            esophagus with cold forceps for histology of                            suspected eosinophilic esophagitis. Verification of                            patient identification for the specimen was done.                            Estimated blood loss was minimal.                           The exam was otherwise without abnormality.                           The cardia and gastric fundus were normal on                            retroflexion. Complications:            No immediate complications. Estimated Blood Loss:     Estimated blood loss was minimal. Impression:               - Esophageal mucosal changes suspicious for  eosinophilic esophagitis. Biopsied. I think                            probably not but worth checking.                           - The examination was otherwise normal. Recommendation:           - Patient has a contact number available for                            emergencies. The signs and symptoms of potential                            delayed complications were discussed with the                            patient. Return to normal activities tomorrow.                            Written discharge instructions were provided to the                            patient.                           - Resume previous diet.                           - Continue present medications.                           - Await pathology results. Will call and reassess.                            He is better but not 100% better on Dexilant. Gatha Mayer,  MD 07/28/2018 10:05:26 AM This report has been signed electronically.

## 2018-07-28 NOTE — Progress Notes (Signed)
To PACU, VSS. Report to [email or web address] 

## 2018-07-28 NOTE — Progress Notes (Signed)
Called to room to assist during endoscopic procedure.  Patient ID and intended procedure confirmed with present staff. Received instructions for my participation in the procedure from the performing physician.  

## 2018-07-28 NOTE — Progress Notes (Signed)
Temp/ vitals  per nancy campbell, lpn

## 2018-07-28 NOTE — Patient Instructions (Addendum)
This looks ok except there is a small chance you have a condition called eosinophilic esophagitis.  I took biopsies to look for that and will let you know.  In eosinophilic esophagitis (e-[id number]-sin-o-FILL-ik uh-[id number]-uh-[id number]-tis), a type of white blood cell (eosinophil) builds up in the lining of the tube that connects your mouth to your stomach (esophagus). This buildup, which is a reaction to foods, allergens or acid reflux, can inflame or injure the esophageal tissue. Damaged esophageal tissue can lead to difficulty swallowing or cause food to get caught when you swallow.  Eosinophilic esophagitis is a chronic immune system disease. It has been identified only in the past two decades, but is now considered a major cause of digestive system (gastrointestinal) illness. Research is ongoing and will likely lead to revisions in its diagnosis and treatment.   I appreciate the opportunity to care for you. Gatha Mayer, MD, [facility]  Discharge instructions given. Biopsies taken. Resume previous medications. YOU HAD AN ENDOSCOPIC PROCEDURE TODAY AT [facility]:   Refer to the procedure report that was given to you for any specific questions about what was found during the examination.  If the procedure report does not answer your questions, please call your gastroenterologist to clarify.  If you requested that your care partner not be given the details of your procedure findings, then the procedure report has been included in a sealed envelope for you to review at your convenience later.  YOU SHOULD EXPECT: Some feelings of bloating in the abdomen. Passage of more gas than usual.  Walking can help get rid of the air that was put into your GI tract during the procedure and reduce the bloating. If you had a lower endoscopy (such as a colonoscopy or flexible sigmoidoscopy) you may notice spotting of blood in your stool or on the toilet paper. If you underwent a bowel prep for your  procedure, you may not have a normal bowel movement for a few days.  Please Note:  You might notice some irritation and congestion in your nose or some drainage.  This is from the oxygen used during your procedure.  There is no need for concern and it should clear up in a day or so.  SYMPTOMS TO REPORT IMMEDIATELY:   Following upper endoscopy (EGD)  Vomiting of blood or coffee ground material  New chest pain or pain under the shoulder blades  Painful or persistently difficult swallowing  New shortness of breath  Fever of 100F or higher  Black, tarry-looking stools  For urgent or emergent issues, a gastroenterologist can be reached at any hour by calling [phone].   DIET:  We do recommend a small meal at first, but then you may proceed to your regular diet.  Drink plenty of fluids but you should avoid alcoholic beverages for 24 hours.  ACTIVITY:  You should plan to take it easy for the rest of today and you should NOT DRIVE or use heavy machinery until tomorrow (because of the sedation medicines used during the test).    FOLLOW UP: Our staff will call the number listed on your records 48-72 hours following your procedure to check on you and address any questions or concerns that you may have regarding the information given to you following your procedure. If we do not reach you, we will leave a message.  We will attempt to reach you two times.  During this call, we will ask if you have developed any symptoms of COVID 19.  If you develop any symptoms (ie: fever, flu-like symptoms, shortness of breath, cough etc.) before then, please call [phone].  If you test positive for Covid 19 in the 2 weeks post procedure, please call and report this information to [place].    If any biopsies were taken you will be contacted by phone or by letter within the next 1-3 weeks.  Please call us at [phone] if you have not heard about the biopsies in 3 weeks.    SIGNATURES/CONFIDENTIALITY: You  and/or your care partner have signed paperwork which will be entered into your electronic medical record.  These signatures attest to the fact that that the information above on your After Visit Summary has been reviewed and is understood.  Full responsibility of the confidentiality of this discharge information lies with you and/or your care-partner.

## 2018-07-28 NOTE — Progress Notes (Signed)
Pt's states no medical or surgical changes since previsit or office visit. 

## 2018-07-31 ENCOUNTER — Telehealth: Payer: Self-pay | Admitting: *Deleted

## 2018-07-31 NOTE — Telephone Encounter (Signed)
  Follow up Call-  Call back number 07/28/2018  Post procedure Call Back phone  # [phone]  Permission to leave phone message Yes  Some recent data might be hidden     Patient questions:  Do you have a fever, pain , or abdominal swelling? No. Pain Score  0 *  Have you tolerated food without any problems? Yes.    Have you been able to return to your normal activities? Yes.    Do you have any questions about your discharge instructions: Diet   No. Medications  No. Follow up visit  No.  Do you have questions or concerns about your Care? No.  Actions: * If pain score is 4 or above: No action needed, pain <4.   1. .Have you developed a fever since your procedure? no  2.   Have you had an respiratory symptoms (SOB or cough) since your procedure? no   3.   Have you tested positive for COVID 19 since your procedure no   4.   Have you had any family members/close contacts diagnosed with the COVID 19 since your procedure?  no  If yes to any of these questions please route to Joylene John, RN and Alphonsa Gin, Therapist, sports.

## 2018-08-08 ENCOUNTER — Other Ambulatory Visit: Payer: Self-pay

## 2018-08-08 ENCOUNTER — Encounter (HOSPITAL_COMMUNITY): Payer: Self-pay | Admitting: Emergency Medicine

## 2018-08-08 ENCOUNTER — Ambulatory Visit (INDEPENDENT_AMBULATORY_CARE_PROVIDER_SITE_OTHER): Payer: 59

## 2018-08-08 ENCOUNTER — Ambulatory Visit (HOSPITAL_COMMUNITY)
Admission: EM | Admit: 2018-08-08 | Discharge: 2018-08-08 | Disposition: A | Discharge status: Home / Self Care | Payer: 59 | Attending: Urgent Care | Admitting: Urgent Care

## 2018-08-08 DIAGNOSIS — M79641 Pain in right hand: Secondary | ICD-10-CM | POA: Diagnosis not present

## 2018-08-08 DIAGNOSIS — S60221A Contusion of right hand, initial encounter: Secondary | ICD-10-CM

## 2018-08-08 DIAGNOSIS — S60511A Abrasion of right hand, initial encounter: Secondary | ICD-10-CM

## 2018-08-08 NOTE — ED Triage Notes (Signed)
Punched a door yesterday morning.  Patient has pain and swelling to right hand, there are random superficial cuts to palm and back of hand from hand going through the door.  Pain is dull and achy.

## 2018-08-08 NOTE — ED Provider Notes (Signed)
MRN: [id number] DOB: 29-Jun-1975  Subjective:   Casey Garcia is a 43 y.o. male presenting for 1 day history of acute onset of right hand pain, swelling from punching through a wooden door.  Patient also reports that he suffered some scrapes.  Has tried to keep his wound clean.  Last Tdap was updated 2017.  No current facility-administered medications for this encounter.   Current Outpatient Medications:  .  atorvastatin (LIPITOR) 20 MG tablet, TAKE 1 TABLET BY MOUTH  DAILY, Disp: 90 tablet, Rfl: 1 .  calcium carbonate (TUMS - DOSED IN MG ELEMENTAL CALCIUM) 500 MG chewable tablet, Chew 1 tablet by mouth as needed for indigestion or heartburn., Disp: , Rfl:  .  dexlansoprazole (DEXILANT) 60 MG capsule, Take 1 capsule (60 mg total) by mouth daily., Disp: 60 capsule, Rfl: 1 .  finasteride (PROPECIA) 1 MG tablet, Take 1 tablet (1 mg total) by mouth daily., Disp: 90 tablet, Rfl: 2 .  montelukast (SINGULAIR) 10 MG tablet, Take 1 tablet (10 mg total) by mouth at bedtime., Disp: 30 tablet, Rfl: 6   Allergies  Allergen Reactions  . Other Other (See Comments)    No symptoms - 'prick test' positive for dust and maple per patient    Past Medical History:  Diagnosis Date  . Allergic rhinitis   . Allergy   . Anxiety   . DDD (degenerative disc disease), cervical   . GERD (gastroesophageal reflux disease)   . Plantar fascia syndrome   . Pure hypercholesterolemia      Past Surgical History:  Procedure Laterality Date  . ANKLE SURGERY Left 2006   Tarsel Tunnel Release  . ESOPHAGOGASTRODUODENOSCOPY     Early 2000's, gastritis, [place]  . KNEE SURGERY  1990   Bilateral knee scope    ROS  Objective:   Vitals: BP 108/74 (BP Location: Left Arm)   Pulse (!) 51   Temp 98.4 F (36.9 C) (Oral)   Resp 18   SpO2 100%   Physical Exam Constitutional:      Appearance: Normal appearance. He is well-developed and normal weight.  HENT:     Head: Normocephalic and atraumatic.     Right Ear:  External ear normal.     Left Ear: External ear normal.     Nose: Nose normal.     Mouth/Throat:     Pharynx: Oropharynx is clear.  Eyes:     Extraocular Movements: Extraocular movements intact.     Pupils: Pupils are equal, round, and reactive to light.  Cardiovascular:     Rate and Rhythm: Normal rate.  Pulmonary:     Effort: Pulmonary effort is normal.  Musculoskeletal:     Right hand: He exhibits decreased range of motion (Full flexion at the MCP joints), tenderness (Over fourth and fifth metacarpals), bony tenderness and swelling (1+ over ulnar aspect of right hand). He exhibits normal capillary refill and no deformity. Normal sensation noted. Normal strength noted.       Hands:  Neurological:     Mental Status: He is alert and oriented to person, place, and time.  Psychiatric:        Mood and Affect: Mood normal.        Behavior: Behavior normal.     Dg Hand Complete Right  Result Date: 08/08/2018 CLINICAL DATA:  Punched a wall EXAM: RIGHT HAND - COMPLETE 3+ VIEW COMPARISON:  None. FINDINGS: There is no evidence of fracture or dislocation. There is no evidence of arthropathy or other focal  bone abnormality. Soft tissues are unremarkable. IMPRESSION: Negative. Electronically Signed   By: Ulyses Jarred M.D.   On: 08/08/2018 20:22    Assessment and Plan :   1. Contusion of right hand, initial encounter   2. Right hand pain   3. Abrasion of right hand, initial encounter     Counseled patient on wound care.  Offered him naproxen for pain and inflammation but patient refuses. Counseled patient on potential for adverse effects with medications prescribed/recommended today, ER and return-to-clinic precautions discussed, patient verbalized understanding.    Jaynee Eagles, PA-C 08/09/18 1009

## 2018-08-08 NOTE — Discharge Instructions (Signed)
You may take 500mg  Tylenol with ibuprofen 600mg  every 6 hours for pain and inflammation.

## 2018-08-09 ENCOUNTER — Encounter: Payer: Self-pay | Admitting: Internal Medicine

## 2018-08-09 DIAGNOSIS — R4689 Other symptoms and signs involving appearance and behavior: Secondary | ICD-10-CM | POA: Insufficient documentation

## 2018-08-09 HISTORY — DX: Other symptoms and signs involving appearance and behavior: R46.89

## 2018-08-09 NOTE — Progress Notes (Signed)
Spoke to patient  Biopsies normal  He told me he has compulsive chewing of buccal mucosa problems, has been a nail biter in past. Having a lot of borborygmi.  Does think he is belching less on Dexilant.  I think he needs SSRI vs SNRI trial and cognitive behavioral therapy most likely.  Believe he has functional GI problems and stress/anxiety ? OCD overlay.  Needs to return to PCP (or a new one as ? If Dr. Mingo Amber still in practice here)  See me prn

## 2018-08-13 ENCOUNTER — Other Ambulatory Visit: Payer: Self-pay | Admitting: *Deleted

## 2018-08-13 MED ORDER — FINASTERIDE 1 MG PO TABS: 1.0000 mg | ORAL_TABLET | Freq: Every day | ORAL | 2 refills | Status: DC

## 2018-08-16 ENCOUNTER — Other Ambulatory Visit: Payer: Self-pay

## 2018-08-16 DIAGNOSIS — Z20822 Contact with and (suspected) exposure to covid-19: Secondary | ICD-10-CM

## 2018-08-18 LAB — NOVEL CORONAVIRUS, NAA: SARS-CoV-2, NAA: NOT DETECTED

## 2018-09-03 ENCOUNTER — Other Ambulatory Visit: Payer: Self-pay

## 2018-09-03 MED ORDER — DEXILANT 60 MG PO CPDR: 60.0000 mg | DELAYED_RELEASE_CAPSULE | Freq: Every day | ORAL | 3 refills | Status: DC

## 2018-09-03 NOTE — Telephone Encounter (Signed)
Dexilant refilled, had EGD procedure in July.

## 2018-10-15 ENCOUNTER — Other Ambulatory Visit: Payer: Self-pay

## 2018-10-15 DIAGNOSIS — Z20822 Contact with and (suspected) exposure to covid-19: Secondary | ICD-10-CM

## 2018-10-16 LAB — NOVEL CORONAVIRUS, NAA: SARS-CoV-2, NAA: NOT DETECTED

## 2018-11-07 ENCOUNTER — Other Ambulatory Visit: Payer: Self-pay

## 2018-11-07 DIAGNOSIS — Z20822 Contact with and (suspected) exposure to covid-19: Secondary | ICD-10-CM

## 2018-11-08 LAB — NOVEL CORONAVIRUS, NAA: SARS-CoV-2, NAA: NOT DETECTED

## 2019-01-15 ENCOUNTER — Ambulatory Visit: Payer: 59 | Attending: Internal Medicine

## 2019-01-15 DIAGNOSIS — U071 COVID-19: Secondary | ICD-10-CM

## 2019-01-15 DIAGNOSIS — R238 Other skin changes: Secondary | ICD-10-CM

## 2019-01-16 LAB — NOVEL CORONAVIRUS, NAA: SARS-CoV-2, NAA: NOT DETECTED

## 2019-02-13 ENCOUNTER — Ambulatory Visit: Payer: 59 | Attending: Internal Medicine

## 2019-02-13 DIAGNOSIS — Z20822 Contact with and (suspected) exposure to covid-19: Secondary | ICD-10-CM

## 2019-02-14 LAB — NOVEL CORONAVIRUS, NAA: SARS-CoV-2, NAA: NOT DETECTED

## 2019-03-25 ENCOUNTER — Ambulatory Visit: Payer: 59 | Attending: Internal Medicine

## 2019-03-26 ENCOUNTER — Ambulatory Visit: Payer: 59 | Attending: Internal Medicine

## 2019-03-26 ENCOUNTER — Other Ambulatory Visit: Payer: Self-pay

## 2019-03-26 DIAGNOSIS — Z20822 Contact with and (suspected) exposure to covid-19: Secondary | ICD-10-CM

## 2019-03-27 LAB — NOVEL CORONAVIRUS, NAA: SARS-CoV-2, NAA: NOT DETECTED

## 2019-04-29 ENCOUNTER — Encounter: Payer: Self-pay | Admitting: Family Medicine

## 2019-04-29 ENCOUNTER — Other Ambulatory Visit: Payer: Self-pay

## 2019-04-29 ENCOUNTER — Ambulatory Visit: Payer: 59 | Admitting: Family Medicine

## 2019-04-29 VITALS — BP 100/70 | Ht 72.0 in | Wt 156.0 lb

## 2019-04-29 DIAGNOSIS — S161XXA Strain of muscle, fascia and tendon at neck level, initial encounter: Secondary | ICD-10-CM
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
## 2019-04-29 NOTE — Patient Instructions (Signed)
You have a cervical strain. Ok to take tylenol for baseline pain relief if needed Aleve 2 tabs twice a day with food for pain and inflammation as needed. Consider a muscle relaxant like robaxin as needed for muscle spasms but it can make you sleepy as you note. Stay as active as possible. Do home exercises and stretches as directed for the next 3-4 weeks. Consider physical therapy if not improving - call me if you want to go ahead with this. Follow up with me in 1 month or as needed if you're doing well.

## 2019-06-06 ENCOUNTER — Other Ambulatory Visit: Payer: Self-pay | Admitting: *Deleted

## 2019-06-06 DIAGNOSIS — E7800 Pure hypercholesterolemia, unspecified: Secondary | ICD-10-CM

## 2019-06-06 MED ORDER — ATORVASTATIN CALCIUM 20 MG PO TABS: 20.0000 mg | ORAL_TABLET | Freq: Every day | ORAL | 0 refills | Status: DC

## 2019-06-06 NOTE — Telephone Encounter (Signed)
Please ask patient to schedule a follow up visit with me - have never met him, he previously saw [facility]. Thanks Brittany J McIntyre, MD  

## 2019-06-06 NOTE — Telephone Encounter (Signed)
Called patient and he would like to check his calendar and call back to schedule an appointment.  Casey Garcia, [place]

## 2019-06-14 ENCOUNTER — Encounter: Payer: Self-pay | Admitting: Family Medicine

## 2019-07-15 NOTE — Progress Notes (Signed)
°  Date of Visit: 07/16/2019   SUBJECTIVE:   HPI:  Casey Garcia presents today for hyperlipidemia.  Hyperlipidemia - taking atorvastatin 20mg  daily. Had recent labs done at [facility] which showed normal CMET and lipids were: Total chol 130 HDL 59 Trig 61 LDL 57  He is overall doing well and has no specific complaints. Seen at an urgent care about a month ago, given amoxicillin for a resp infection and diagnosed with musculoskeletal chest pain, which has resolved.  Manages a Copywriter, advertising. Has no concerns for STDs, declines screening today Denies family history of prostate cancer.  OBJECTIVE:   BP 98/60    Pulse 98    Wt 160 lb 12.8 oz (72.9 kg)    SpO2 98%    BMI 21.81 kg/m  Gen: no acute distress, pleasant, cooperative HEENT: normocephalic, atraumatic  Heart: regular rate and rhythm, no murmur Lungs: clear to auscultation bilaterally, normal work of breathing  Neuro: alert, speech normal, grossly nonfocal  ASSESSMENT/PLAN:   Health maintenance:  -hep C screening test done today -has received both doses of COVID vaccine -declines STD screening today  Pure hypercholesterolemia Lipids excellent on recent check. Will scan labs in to chart. Continue atorvastatin, refilled x1 year.  Hair loss Has been on finasteride for 7 years for hair loss. No recent worsening of hair loss. Continue finasteride.  FOLLOW UP: Follow up in 1 year for hyperlipidemia   Casey Garcia, [place]

## 2019-07-16 ENCOUNTER — Ambulatory Visit (INDEPENDENT_AMBULATORY_CARE_PROVIDER_SITE_OTHER): Payer: 59 | Admitting: Family Medicine

## 2019-07-16 ENCOUNTER — Other Ambulatory Visit: Payer: Self-pay

## 2019-07-16 VITALS — BP 98/60 | HR 98 | Wt 160.8 lb

## 2019-07-16 DIAGNOSIS — E7800 Pure hypercholesterolemia, unspecified: Secondary | ICD-10-CM

## 2019-07-16 DIAGNOSIS — E785 Hyperlipidemia, unspecified: Secondary | ICD-10-CM

## 2019-07-16 DIAGNOSIS — Z1159 Encounter for screening for other viral diseases: Secondary | ICD-10-CM

## 2019-07-16 DIAGNOSIS — L659 Nonscarring hair loss, unspecified: Secondary | ICD-10-CM | POA: Diagnosis not present

## 2019-07-16 MED ORDER — ATORVASTATIN CALCIUM 20 MG PO TABS: 20.0000 mg | ORAL_TABLET | Freq: Every day | ORAL | 3 refills | Status: DC

## 2019-07-16 NOTE — Patient Instructions (Signed)
It was nice to meet you today!  Refilled atorvastatin Follow up in 1 year  Checking hepatitis C test today  Be well, Dr. Ardelia Mems

## 2019-07-16 NOTE — Assessment & Plan Note (Signed)
Has been on finasteride for 7 years for hair loss. No recent worsening of hair loss. Continue finasteride.

## 2019-07-16 NOTE — Assessment & Plan Note (Signed)
Lipids excellent on recent check. Will scan labs in to chart. Continue atorvastatin, refilled x1 year.

## 2019-07-17 ENCOUNTER — Encounter: Payer: Self-pay | Admitting: Family Medicine

## 2019-07-17 LAB — HEPATITIS C ANTIBODY (REFLEX): HCV Ab: <0.1 s/co ratio (ref 0.0–0.9)

## 2019-07-17 LAB — HCV COMMENT:

## 2019-08-02 ENCOUNTER — Encounter: Payer: Self-pay | Admitting: Family Medicine

## 2019-08-05 MED ORDER — FINASTERIDE 1 MG PO TABS: 1.0000 mg | ORAL_TABLET | Freq: Every day | ORAL | 2 refills | Status: AC

## 2019-09-27 ENCOUNTER — Other Ambulatory Visit: Payer: Self-pay | Admitting: Family Medicine

## 2019-09-27 DIAGNOSIS — E7800 Pure hypercholesterolemia, unspecified: Secondary | ICD-10-CM

## 2020-01-30 ENCOUNTER — Encounter: Payer: Self-pay | Admitting: Family Medicine

## 2020-06-10 IMAGING — MR MR HIP*R* W/O CM
4 of 5 series · 21 of 40 positions shown · non-contrast
Comparison: Plain film of the pelvis 03/10/2016.

CLINICAL DATA: Right hip pain and popping. No recent injury. The
patient reports an injury playing soccer 2 years ago.

EXAM:
MR OF THE RIGHT HIP WITHOUT CONTRAST
TECHNIQUE: Multiplanar, multisequence MR imaging was performed. No intravenous
contrast was administered.

[Series 2: T1 · coronal · 4.0mm · 0.62mm/px · 9 of 34 slices shown]
[im 1/34]
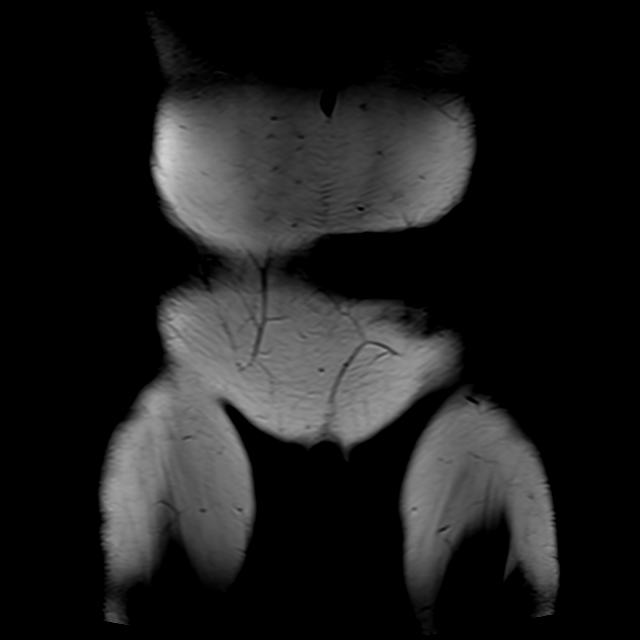
[im 5/34]
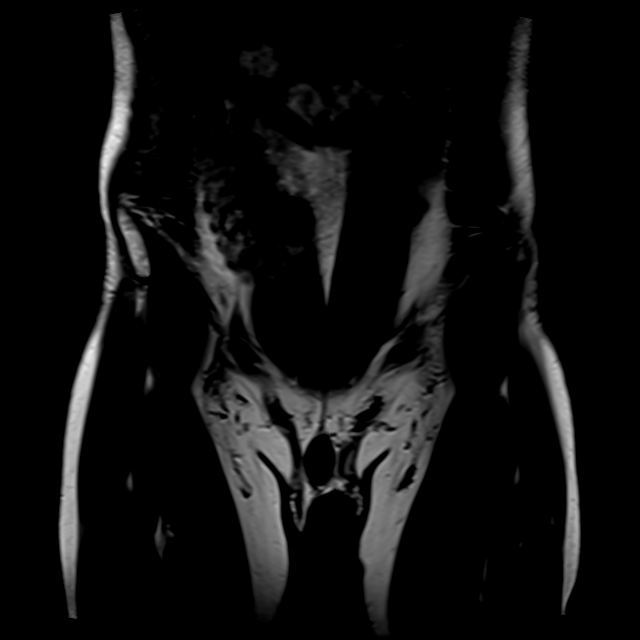
[im 9/34]
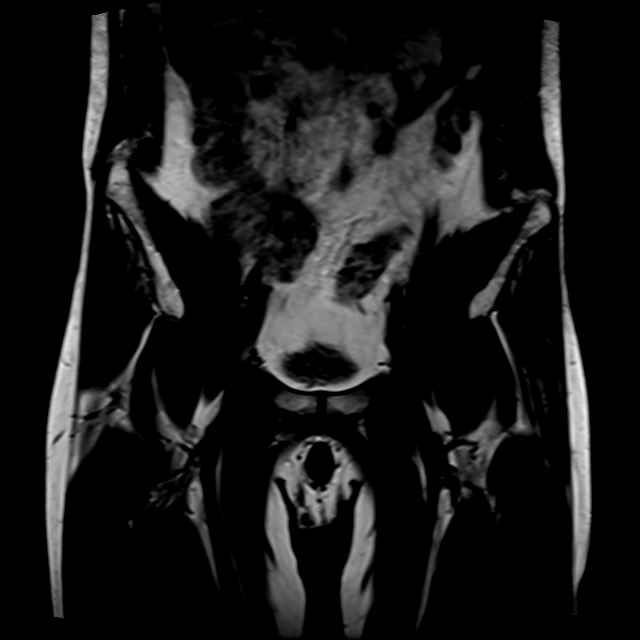
[im 13/34]
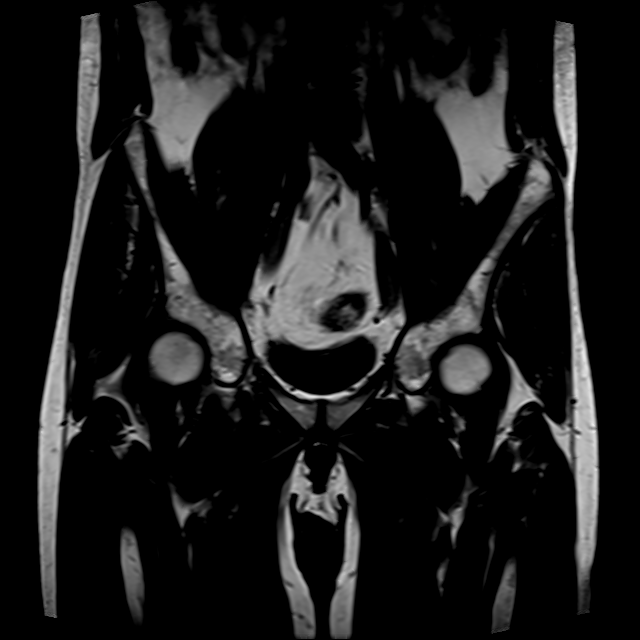
[im 17/34]
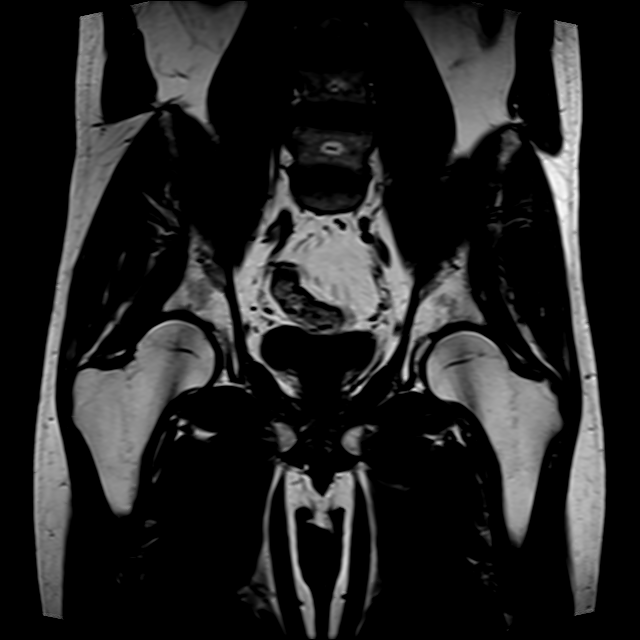
[im 21/34]
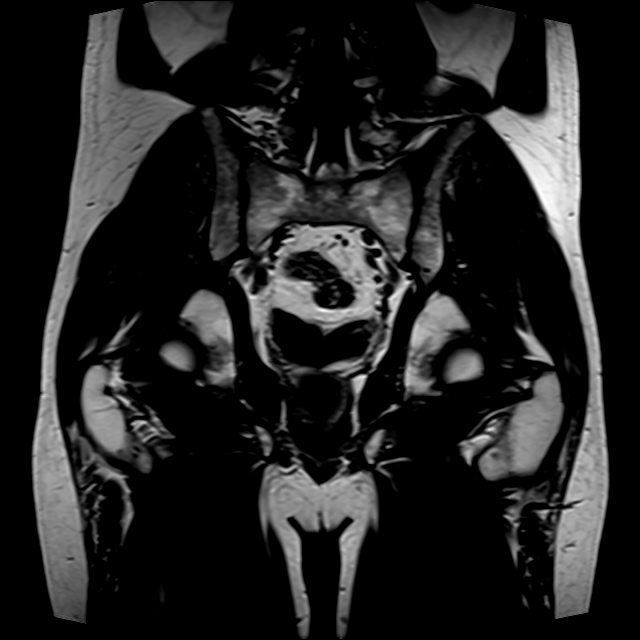
[im 25/34]
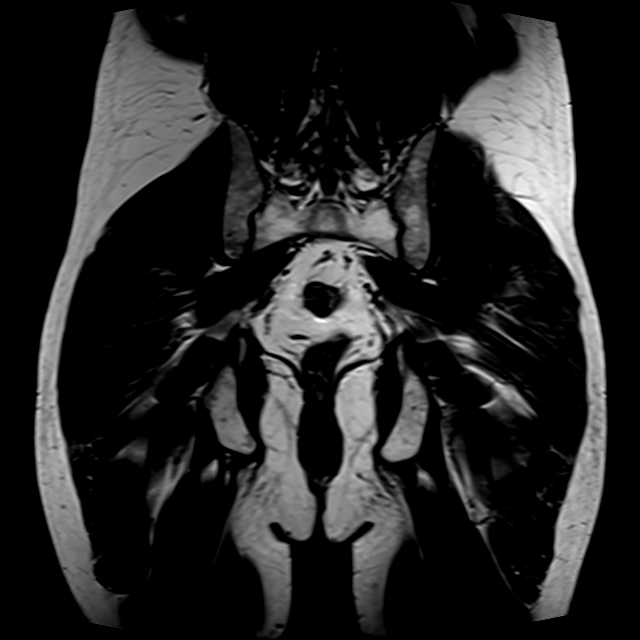
[im 29/34]
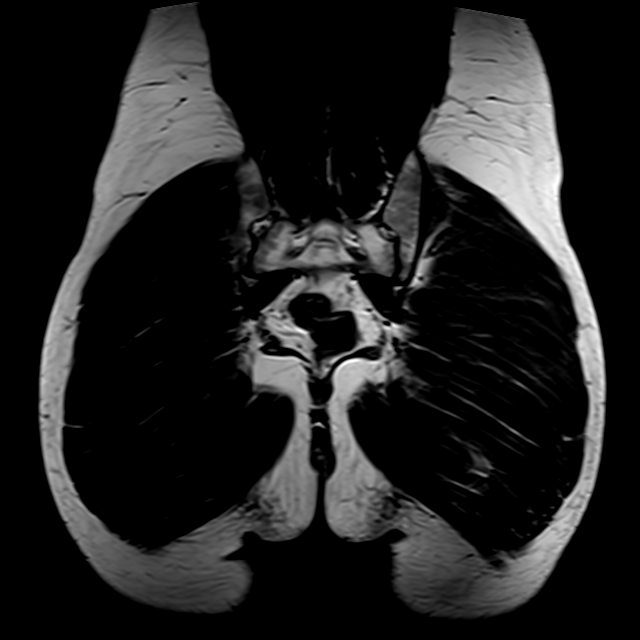
[im 34/34]
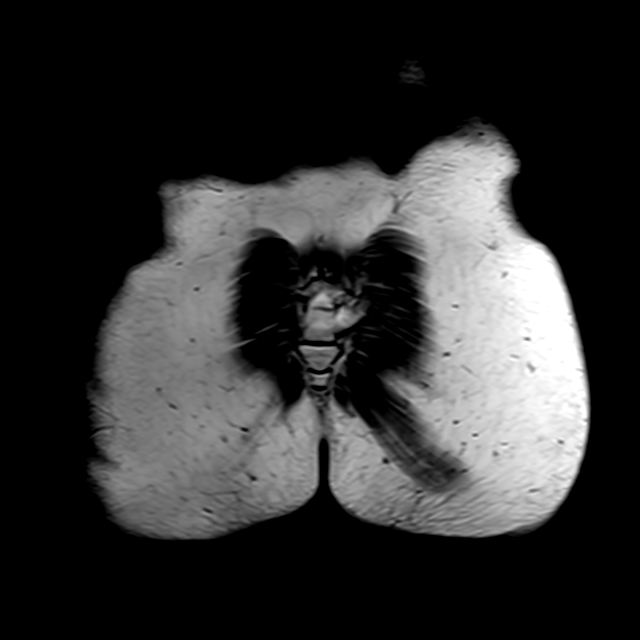

[Series 3: T2 fat-sat · coronal · 4.0mm · 1.25mm/px · 6 of 34 slices shown (1 of 2)]
[im 1/34]
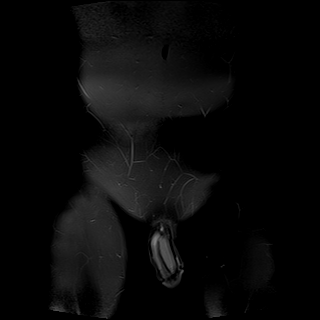
[im 4/34]
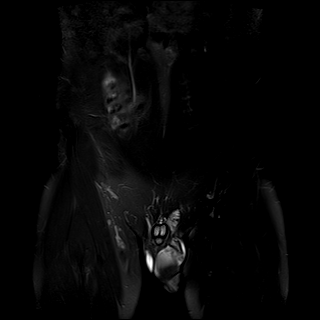
[im 12/34]
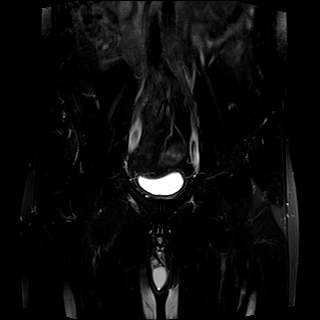
[im 15/34]
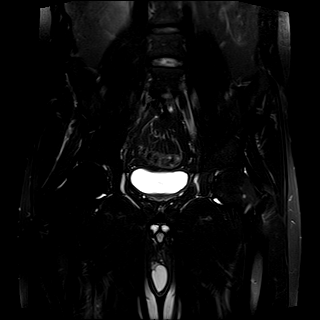
[im 19/34]
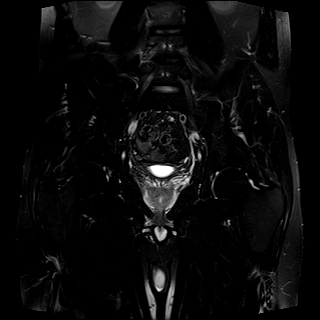
[im 30/34]
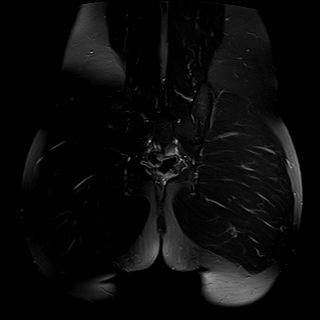

[Series 4: T2 fat-sat · axial · 4.0mm · 0.35mm/px · z∈[-83,-3]mm · 3 of 24 slices shown (2 of 2)]
[im 4/24]
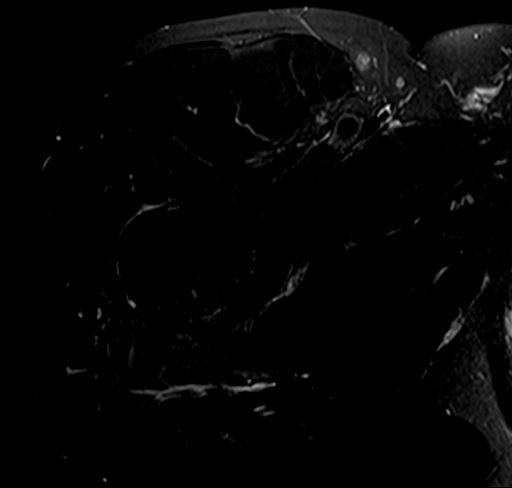
[im 12/24]
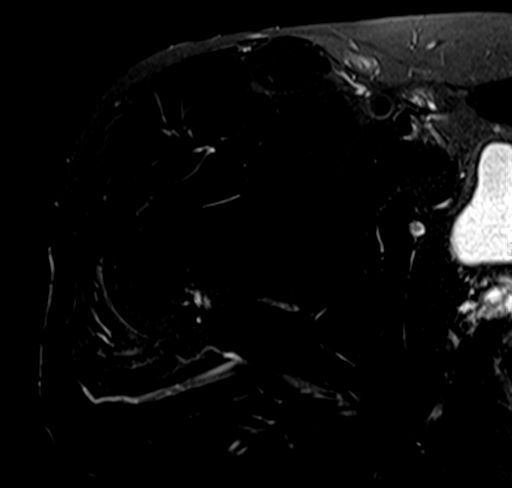
[im 20/24]
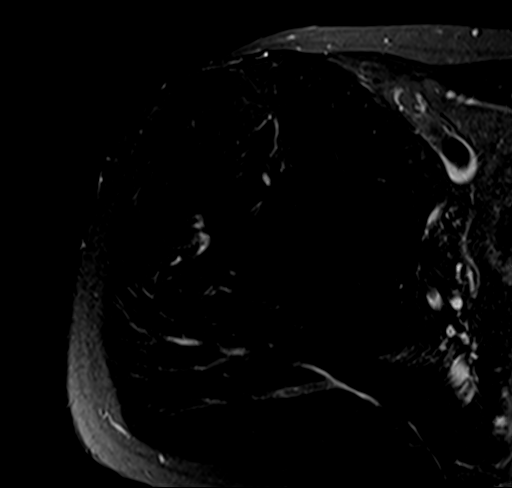

[Series 5: PD fat-sat · coronal · 4.0mm · 0.70mm/px · 3 of 19 slices shown]
[im 4/19]
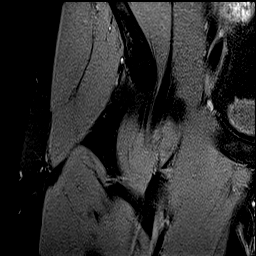
[im 11/19]
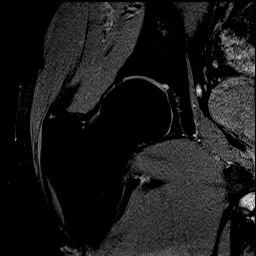
[im 19/19]
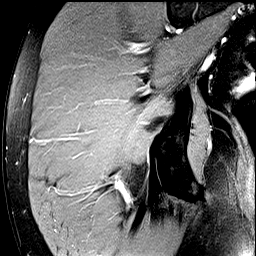

[21 of 40 positions shown; findings below may reference images not displayed]

FINDINGS: Bones: Tiny synovial herniation pit is seen in the anterior aspect
of the left hip at the head neck junction. Bone marrow signal is
otherwise unremarkable without fracture, stress change or focal
lesion. No avascular necrosis of the femoral heads. No subchondral
cyst formation or edema about the hips.

Articular cartilage and labrum

Articular cartilage:  Normal.

Labrum:  Intact.

Joint or bursal effusion

Joint effusion:  None.

Bursae: Normal.

Muscles and tendons

Muscles and tendons:  Normal.

Other findings

Miscellaneous:   Imaged intrapelvic contents appear normal.
IMPRESSION: Normal examination.  No finding to explain the patient's symptoms.

## 2020-09-28 DIAGNOSIS — M25551 Pain in right hip: Secondary | ICD-10-CM | POA: Diagnosis not present

## 2020-09-28 DIAGNOSIS — M25851 Other specified joint disorders, right hip: Secondary | ICD-10-CM | POA: Diagnosis not present

## 2020-10-05 DIAGNOSIS — M6281 Muscle weakness (generalized): Secondary | ICD-10-CM | POA: Diagnosis not present

## 2020-10-05 DIAGNOSIS — M25551 Pain in right hip: Secondary | ICD-10-CM | POA: Diagnosis not present

## 2020-10-05 DIAGNOSIS — M6289 Other specified disorders of muscle: Secondary | ICD-10-CM | POA: Diagnosis not present

## 2020-10-05 DIAGNOSIS — M25851 Other specified joint disorders, right hip: Secondary | ICD-10-CM | POA: Diagnosis not present

## 2020-10-16 DIAGNOSIS — M25551 Pain in right hip: Secondary | ICD-10-CM | POA: Diagnosis not present

## 2020-10-16 DIAGNOSIS — M25851 Other specified joint disorders, right hip: Secondary | ICD-10-CM | POA: Diagnosis not present

## 2020-10-23 DIAGNOSIS — M25851 Other specified joint disorders, right hip: Secondary | ICD-10-CM | POA: Diagnosis not present

## 2020-10-23 DIAGNOSIS — M25551 Pain in right hip: Secondary | ICD-10-CM | POA: Diagnosis not present

## 2020-12-18 DIAGNOSIS — M7611 Psoas tendinitis, right hip: Secondary | ICD-10-CM | POA: Diagnosis not present

## 2020-12-18 DIAGNOSIS — M25851 Other specified joint disorders, right hip: Secondary | ICD-10-CM | POA: Diagnosis not present

## 2020-12-29 ENCOUNTER — Encounter: Payer: Self-pay | Admitting: Sports Medicine

## 2021-01-27 IMAGING — DX RIGHT HAND - COMPLETE 3+ VIEW
3 series · 3 of 3 positions shown · non-contrast
Comparison: None.

CLINICAL DATA: Punched a wall

EXAM:
RIGHT HAND - COMPLETE 3+ VIEW

[hand pa]
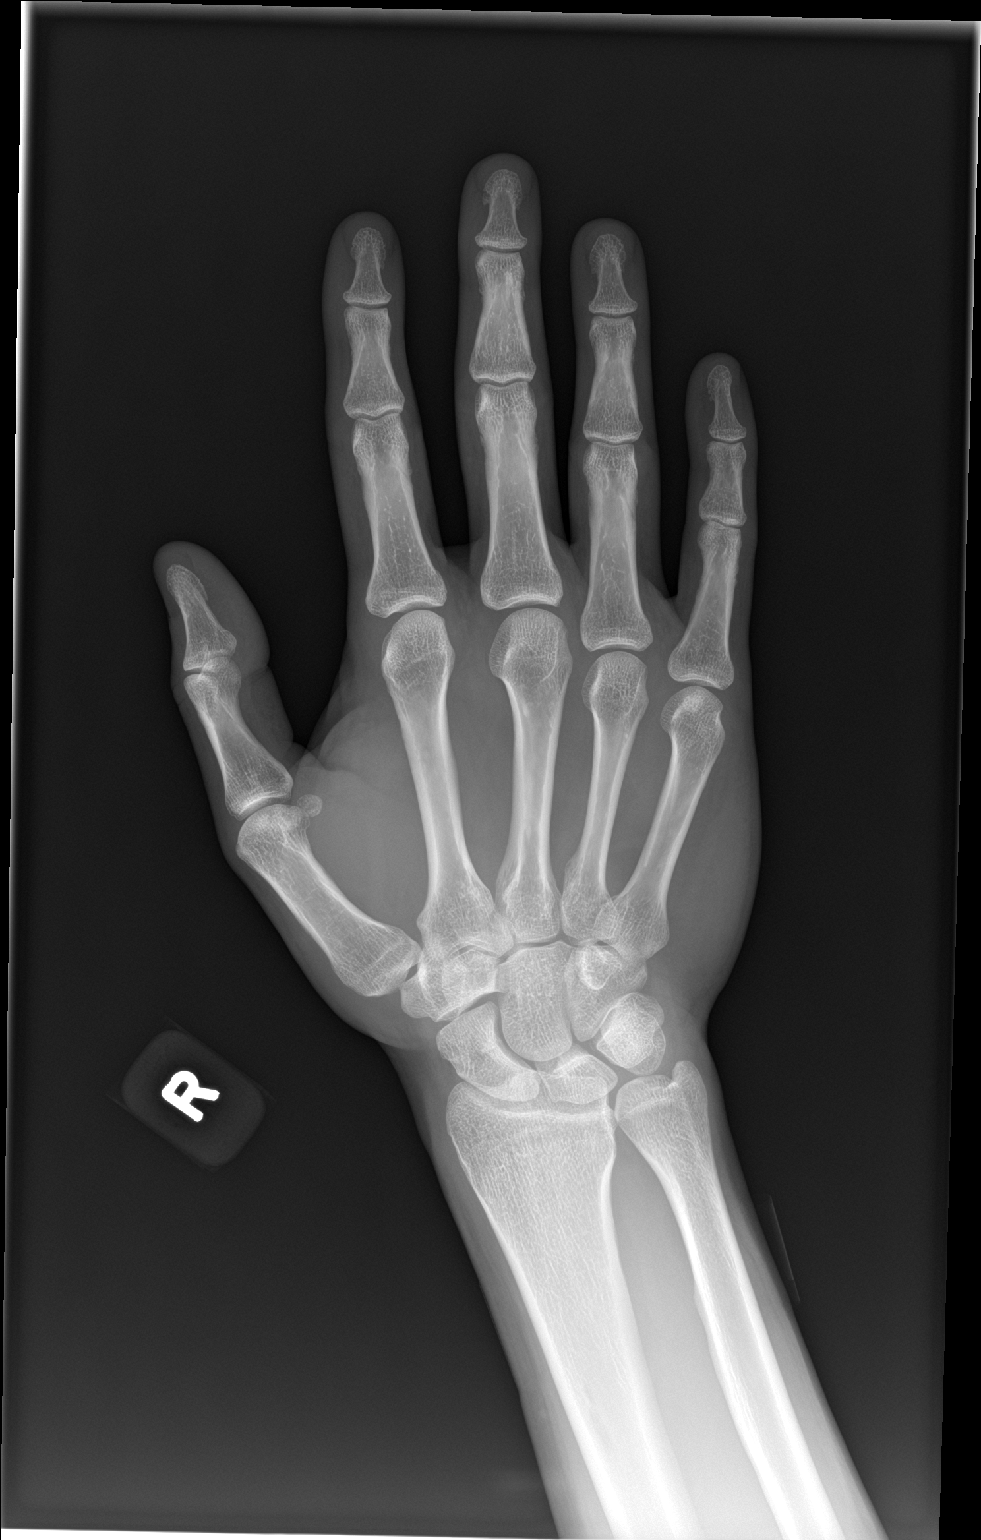

[hand obl]
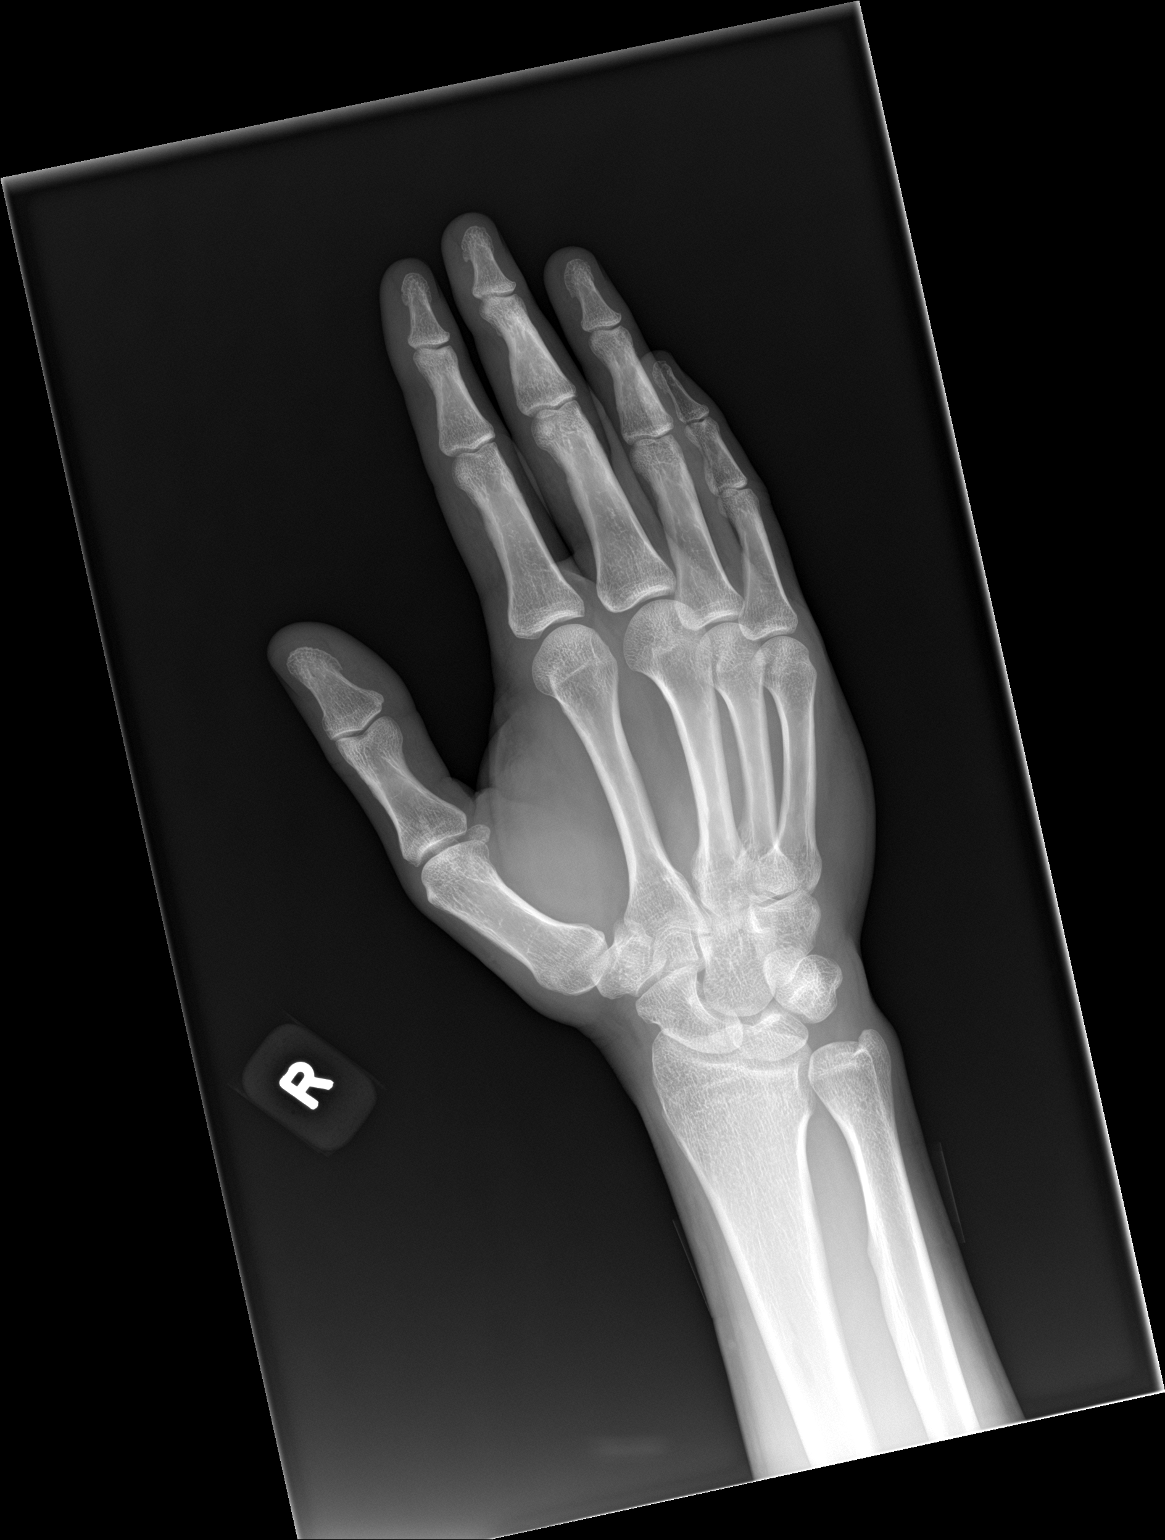

[hand lat]
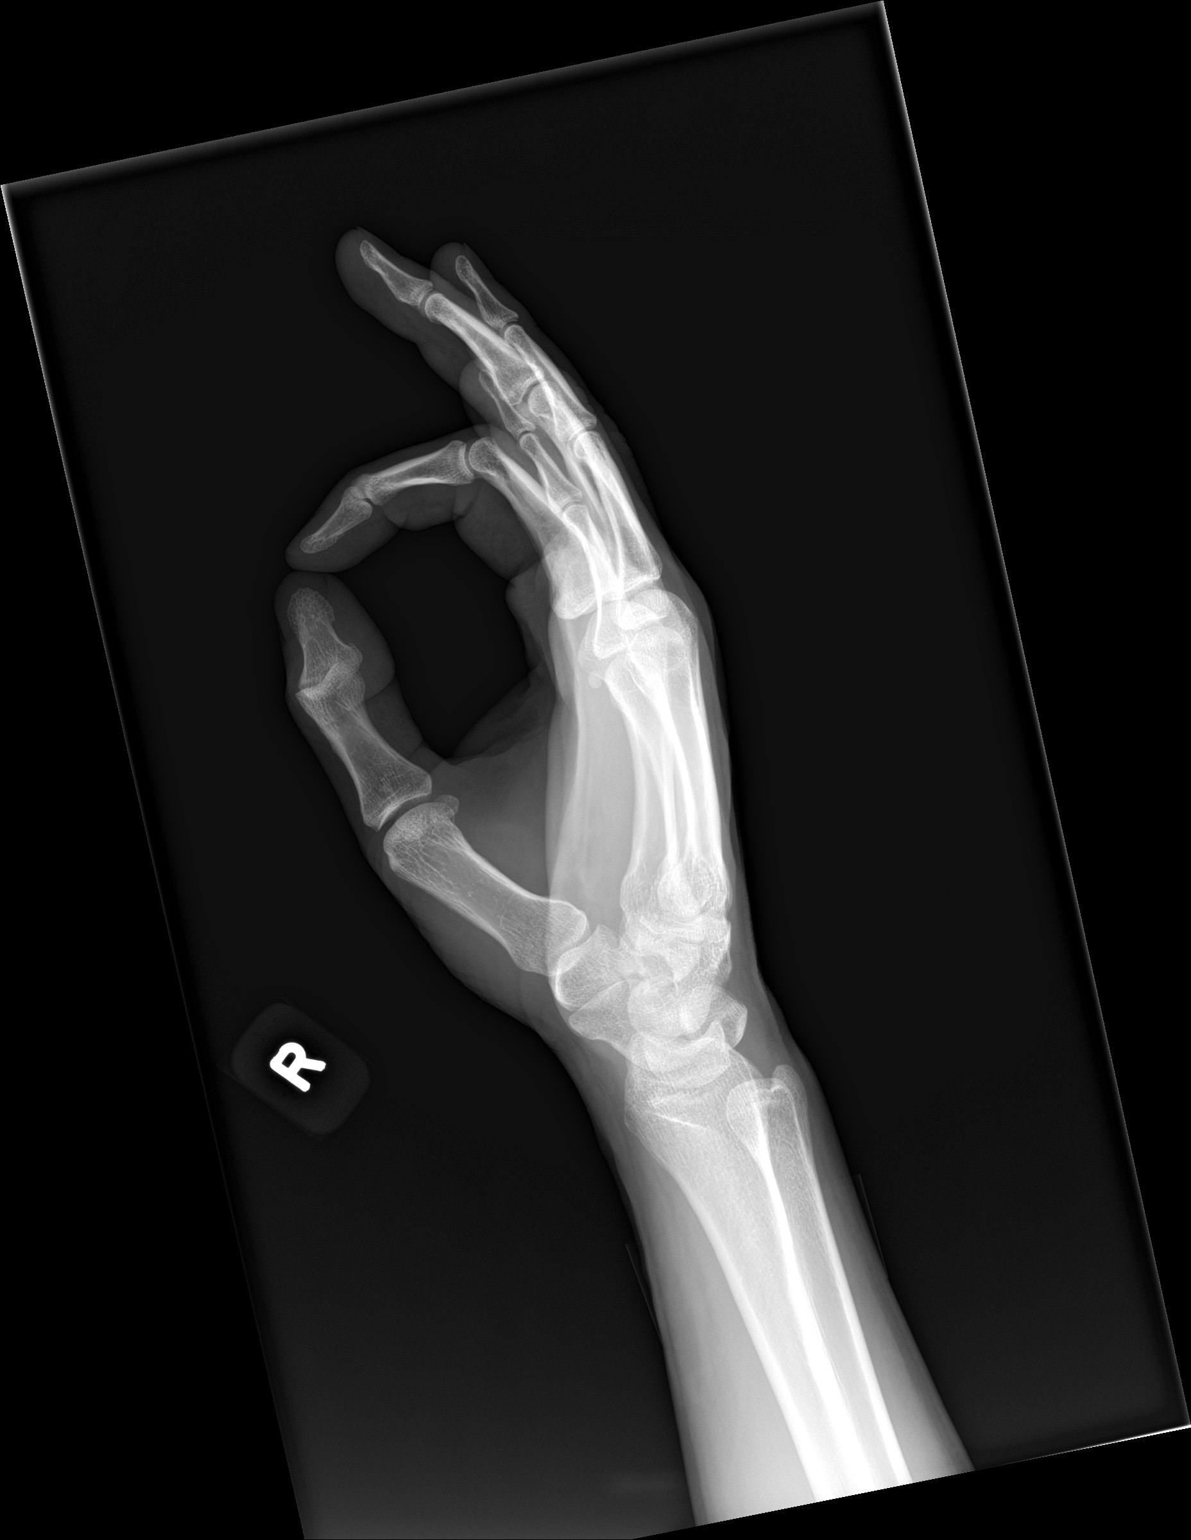

[3 of 3 positions shown; findings below may reference images not displayed]

FINDINGS: There is no evidence of fracture or dislocation. There is no
evidence of arthropathy or other focal bone abnormality. Soft
tissues are unremarkable.
IMPRESSION: Negative.

## 2021-01-28 ENCOUNTER — Ambulatory Visit: Payer: Self-pay | Admitting: Sports Medicine

## 2021-02-01 DIAGNOSIS — L089 Local infection of the skin and subcutaneous tissue, unspecified: Secondary | ICD-10-CM | POA: Diagnosis not present

## 2021-02-01 DIAGNOSIS — D229 Melanocytic nevi, unspecified: Secondary | ICD-10-CM | POA: Diagnosis not present

## 2021-02-01 DIAGNOSIS — Z85828 Personal history of other malignant neoplasm of skin: Secondary | ICD-10-CM | POA: Diagnosis not present

## 2021-02-01 DIAGNOSIS — L814 Other melanin hyperpigmentation: Secondary | ICD-10-CM | POA: Diagnosis not present

## 2021-02-01 DIAGNOSIS — B351 Tinea unguium: Secondary | ICD-10-CM | POA: Diagnosis not present

## 2021-02-01 DIAGNOSIS — L910 Hypertrophic scar: Secondary | ICD-10-CM | POA: Diagnosis not present

## 2021-02-16 ENCOUNTER — Ambulatory Visit: Payer: 59 | Admitting: Sports Medicine

## 2021-02-23 ENCOUNTER — Ambulatory Visit: Payer: Self-pay | Admitting: Sports Medicine

## 2021-03-25 ENCOUNTER — Ambulatory Visit (INDEPENDENT_AMBULATORY_CARE_PROVIDER_SITE_OTHER): Payer: BC Managed Care – PPO | Admitting: Sports Medicine

## 2021-03-25 VITALS — BP 122/84 | Ht 72.0 in | Wt 160.0 lb

## 2021-03-25 DIAGNOSIS — M25551 Pain in right hip: Secondary | ICD-10-CM | POA: Diagnosis not present

## 2021-03-25 NOTE — Progress Notes (Signed)
? ?  Casey Garcia is a 46 y.o. male who presents to [facility] today for the following: ? ?Chronic Right Hip Pain ?Had MRA that showed a labral tear, mild femoral CAM deformity ?Dr. Marvis Repress also noted possible iliopsoas tendinitis given snapping symptoms ?Has had prior CSI US guided with no improvement ?Had been offered a flexor tendon injection, but did not have this done ?Overall, started about 4-6 years ago while playing soccer ?Hasn't played soccer since then ?Used to run but is now unable to because after 1 mile he has some discomfort deep in his hip joint and will limp afterwards for about 2 to 3 days ?During his normal day-to-day life he does not have significant pain ?He does note that if he does certain movements of his head he will feel a popping but its not painful ?He is hoping to get back to some sort of exercise and would like some recommendations in regards to what treatment options he has ?Pain is mostly in the anterior aspect of his hip, deep in the joint, sometimes in the posterior region, hard to localize ? ? ?PMH reviewed.  ?ROS as above. ?Medications reviewed. ? ?Exam:  ?BP 122/84   Ht 6' (1.829 m)   Wt 160 lb (72.6 kg)   BMI 21.70 kg/m?  ?Gen: Well NAD ?MSK: ? ?Right hip:  ?- Inspection: No gross deformity, no swelling, erythema, or ecchymosis b/l ?- Palpation: No TTP, specifically none over greater trochanter b/l ?- ROM: Normal range of motion on Flexion, extension, abduction, internal and external rotation b/l ?- Strength: Normal strength in all fields b/l ?- Neuro/vasc: NV intact distally b/l ?- Special Tests: Negative FABER but has significant tightness bilaterally, positive FADIR on right.  Negative Stinchfield, negative logroll.  With palpation of his femoral acetabular joint while performing flexion abduction and abduction of his hip in a circular motion, there is a palpable clunk within the femoral acetabular joint.  There is no palpable click when stressing the iliopsoas.  Normal running  form without pain in hallway. ? ? ?No results found. ? ? ?Assessment and Plan: ?1) Right hip pain ?Less likely to be related to the small labral tear that has been noted on prior imaging.  Most likely his symptoms are related to some femoral acetabular impingement with the mild cam deformity noted and the clunk on examination.  Given that he is minimally symptomatic throughout his daily life, would recommend trying to get back to running, but starting in a progressive functional capacity.  Recommend progressing from there as long as he does well and trying to get back to soccer if he is able.  Reassured him that this should not significantly worsen as it has not gotten progressively worse over the last 4 to 6 years.  Would recommend that he try to get back to his activities as well as he can without any fear of worsening the injury.  Can follow-up as needed. ? ? ?Ari[place] Constable, D.O.  ?PGY-4 [facility]  ?03/25/2021 5:26 PM ? ?I observed and examined the patient with the [facility] resident and agree with assessment and plan.  Note reviewed and modified by me. ?Ila Mcgill, MD ?

## 2021-03-25 NOTE — Assessment & Plan Note (Signed)
Less likely to be related to the small labral tear that has been noted on prior imaging.  Most likely his symptoms are related to some femoral acetabular impingement with the mild cam deformity noted and the clunk on examination.  Given that he is minimally symptomatic throughout his daily life, would recommend trying to get back to running, but starting in a progressive functional capacity.  Recommend progressing from there as long as he does well and trying to get back to soccer if he is able.  Reassured him that this should not significantly worsen as it has not gotten progressively worse over the last 4 to 6 years.  Would recommend that he try to get back to his activities as well as he can without any fear of worsening the injury.  Can follow-up as needed. ?

## 2021-06-22 ENCOUNTER — Encounter: Payer: Self-pay | Admitting: *Deleted

## 2021-07-12 DIAGNOSIS — Z0000 Encounter for general adult medical examination without abnormal findings: Secondary | ICD-10-CM | POA: Diagnosis not present

## 2021-07-12 DIAGNOSIS — M25551 Pain in right hip: Secondary | ICD-10-CM | POA: Diagnosis not present

## 2021-07-12 DIAGNOSIS — E785 Hyperlipidemia, unspecified: Secondary | ICD-10-CM | POA: Diagnosis not present

## 2021-07-12 DIAGNOSIS — G4700 Insomnia, unspecified: Secondary | ICD-10-CM | POA: Diagnosis not present

## 2021-07-12 DIAGNOSIS — Z125 Encounter for screening for malignant neoplasm of prostate: Secondary | ICD-10-CM | POA: Diagnosis not present

## 2021-08-18 ENCOUNTER — Ambulatory Visit: Payer: BC Managed Care – PPO | Admitting: Family Medicine

## 2021-08-24 ENCOUNTER — Ambulatory Visit: Payer: BC Managed Care – PPO | Admitting: Sports Medicine

## 2021-08-24 VITALS — BP 106/72 | Ht 71.0 in | Wt 160.0 lb

## 2021-08-24 DIAGNOSIS — M79641 Pain in right hand: Secondary | ICD-10-CM | POA: Diagnosis not present

## 2021-08-24 DIAGNOSIS — M722 Plantar fascial fibromatosis: Secondary | ICD-10-CM

## 2021-08-24 DIAGNOSIS — M79672 Pain in left foot: Secondary | ICD-10-CM | POA: Diagnosis not present

## 2021-08-24 DIAGNOSIS — M79671 Pain in right foot: Secondary | ICD-10-CM | POA: Diagnosis not present
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
## 2021-08-24 NOTE — Assessment & Plan Note (Addendum)
Pain along plantar surface of the radical arch of bilateral feet.  Very slight tenderness to palpation at the insertion of the plantar fascia.  I do not believe his pain is secondary to planter fasciitis at this time.  Likely source of his pain is loss of arch support.  He was given today sports insole with scaphoid pad for support bilaterally.  He tested these out in clinic and was able to tolerate them well.  Patient to return to clinic for insole modification if his pain persists.

## 2021-10-30 DIAGNOSIS — M546 Pain in thoracic spine: Secondary | ICD-10-CM | POA: Diagnosis not present

## 2021-11-15 DIAGNOSIS — G4733 Obstructive sleep apnea (adult) (pediatric): Secondary | ICD-10-CM | POA: Diagnosis not present

## 2021-11-22 ENCOUNTER — Encounter: Payer: Self-pay | Admitting: Sports Medicine

## 2021-12-27 DIAGNOSIS — G4733 Obstructive sleep apnea (adult) (pediatric): Secondary | ICD-10-CM | POA: Diagnosis not present

## 2022-01-20 DIAGNOSIS — J069 Acute upper respiratory infection, unspecified: Secondary | ICD-10-CM | POA: Diagnosis not present

## 2022-01-20 DIAGNOSIS — J029 Acute pharyngitis, unspecified: Secondary | ICD-10-CM | POA: Diagnosis not present

## 2022-02-02 DIAGNOSIS — R051 Acute cough: Secondary | ICD-10-CM | POA: Diagnosis not present

## 2022-03-18 DIAGNOSIS — F5101 Primary insomnia: Secondary | ICD-10-CM | POA: Diagnosis not present

## 2023-01-04 ENCOUNTER — Telehealth: Payer: BC Managed Care – PPO

## 2023-01-04 DIAGNOSIS — B9689 Other specified bacterial agents as the cause of diseases classified elsewhere: Secondary | ICD-10-CM

## 2023-01-04 DIAGNOSIS — J019 Acute sinusitis, unspecified: Secondary | ICD-10-CM

## 2023-01-04 MED ORDER — PROMETHAZINE-DM 6.25-15 MG/5ML PO SYRP: 5.0000 mL | ORAL_SOLUTION | Freq: Four times a day (QID) | ORAL | 0 refills | Status: AC | PRN

## 2023-01-04 MED ORDER — AMOXICILLIN-POT CLAVULANATE 875-125 MG PO TABS: 1.0000 | ORAL_TABLET | Freq: Two times a day (BID) | ORAL | 0 refills | Status: AC

## 2023-01-04 NOTE — Patient Instructions (Addendum)
  Corrinne Eagle, thank you for joining Casey Finner, NP for today's virtual visit.  While this provider is not your primary care provider (PCP), if your PCP is located in our provider database this encounter information will be shared with them immediately following your visit.   A [facility] MyChart account gives you access to today's visit and all your visits, tests, and labs performed at [facility] " click here if you don't have a [facility] MyChart account or go to [email or web address]  Consent: (Patient) Casey Garcia provided verbal consent for this virtual visit at the beginning of the encounter.  Current Medications:  Current Outpatient Medications:    amoxicillin-clavulanate (AUGMENTIN) 875-125 MG tablet, Take 1 tablet by mouth 2 (two) times daily for 7 days., Disp: 14 tablet, Rfl: 0   promethazine-dextromethorphan (PROMETHAZINE-DM) 6.25-15 MG/5ML syrup, Take 5 mLs by mouth 4 (four) times daily as needed for cough., Disp: 118 mL, Rfl: 0   atorvastatin (LIPITOR) 20 MG tablet, TAKE 1 TABLET BY MOUTH  DAILY, Disp: 90 tablet, Rfl: 3   finasteride (PROPECIA) 1 MG tablet, Take 1 tablet (1 mg total) by mouth daily., Disp: 90 tablet, Rfl: 2   Medications ordered in this encounter:  Meds ordered this encounter  Medications   amoxicillin-clavulanate (AUGMENTIN) 875-125 MG tablet    Sig: Take 1 tablet by mouth 2 (two) times daily for 7 days.    Dispense:  14 tablet    Refill:  0    Supervising Provider:   Merrilee Jansky [[id number]]   promethazine-dextromethorphan (PROMETHAZINE-DM) 6.25-15 MG/5ML syrup    Sig: Take 5 mLs by mouth 4 (four) times daily as needed for cough.    Dispense:  118 mL    Refill:  0    Supervising Provider:   Merrilee Jansky [[id number]]     *If you need refills on other medications prior to your next appointment, please contact your pharmacy*  Follow-Up: Call back or seek an in-person evaluation if the symptoms worsen or if the condition  fails to improve as anticipated.  [facility] [phone]  Other Instructions  URI recommendations: - Increased rest - Increasing Fluids - Acetaminophen / ibuprofen as needed for fever/pain.  - Salt water gargling, chloraseptic spray and throat lozenges - Mucinex- plain, if mucus is present and increasing.  - Saline nasal spray if congestion or if nasal passages feel dry. - Humidifying the air.     If you have been instructed to have an in-person evaluation today at a local Urgent Care facility, please use the link below. It will take you to a list of all of our available [facility], including address, phone number and hours of operation. Please do not delay care.  [facility]  If you or a family member do not have a primary care provider, use the link below to schedule a visit and establish care. When you choose a [facility] primary care physician or advanced practice provider, you gain a long-term partner in health. Find a Primary Care Provider  Learn more about [facility]'s in-office and virtual care options:  - Get Care Now

## 2023-01-04 NOTE — Progress Notes (Signed)
Virtual Visit Consent   Casey Garcia, you are scheduled for a virtual visit with a [facility] provider today. Just as with appointments in the office, your consent must be obtained to participate. Your consent will be active for this visit and any virtual visit you may have with one of our providers in the next 365 days. If you have a MyChart account, a copy of this consent can be sent to you electronically.  As this is a virtual visit, video technology does not allow for your provider to perform a traditional examination. This may limit your provider's ability to fully assess your condition. If your provider identifies any concerns that need to be evaluated in person or the need to arrange testing (such as labs, EKG, etc.), we will make arrangements to do so. Although advances in technology are sophisticated, we cannot ensure that it will always work on either your end or our end. If the connection with a video visit is poor, the visit may have to be switched to a telephone visit. With either a video or telephone visit, we are not always able to ensure that we have a secure connection.  By engaging in this virtual visit, you consent to the provision of healthcare and authorize for your insurance to be billed (if applicable) for the services provided during this visit. Depending on your insurance coverage, you may receive a charge related to this service.  I need to obtain your verbal consent now. Are you willing to proceed with your visit today? Casey Garcia has provided verbal consent on 01/04/2023 for a virtual visit (video or telephone). Freddy Finner, NP  Date: 01/04/2023 9:33 AM  Virtual Visit via Video Note   I, Freddy Finner, connected with  Casey Garcia  ([id number], [id number]) on 01/04/23 at  9:30 AM EST by a video-enabled telemedicine application and verified that I am speaking with the correct person using two identifiers.  Location: Patient: Virtual Visit Location Patient:  Other: work Provider: Pharmacist, community: Home Office   I discussed the limitations of evaluation and management by telemedicine and the availability of in person appointments. The patient expressed understanding and agreed to proceed.    History of Present Illness: Casey Garcia is a 47 y.o. who identifies as a male who was assigned male at birth, and is being seen today for cold/ URI   The second time on Friday - 5 days ago Was sick two week prior to that with a cold and run down, lasted about a week, and started getting better, then 1-2 days later it came back on with fatigue.  Associated symptoms- nasal drainage, ear pressure and popping, sore throat, sneezing, coughing, headache. Modifying factors- tums, cough drops, day quil  Exposure to sick contacts- work colleagues    Problems:  Patient Active Problem List   Diagnosis Date Noted   Pain in both feet 08/24/2021   Right hand pain 08/24/2021   Compulsive behavior 08/09/2018   Dyspepsia 04/26/2018   Chest pain 04/26/2018   Allergic rhinitis 01/26/2018   Flatulence 01/26/2018   Chronic sinusitis 08/12/2016   Right hip pain 03/10/2016   Mild right atrial enlargement 07/24/2015   Ventricular enlargement, right 07/24/2015   ASD (atrial septal defect) 06/19/2015   DDD (degenerative disc disease), cervical 06/19/2015   Pure hypercholesterolemia 04/03/2015   Fatigue 04/03/2015   Hair loss 04/03/2015   Plantar fascia syndrome 09/14/2010    Allergies:  Allergies  Allergen Reactions  Other Other (See Comments)    No symptoms - 'prick test' positive for dust and maple per patient   Medications:  Current Outpatient Medications:    atorvastatin (LIPITOR) 20 MG tablet, TAKE 1 TABLET BY MOUTH  DAILY, Disp: 90 tablet, Rfl: 3   finasteride (PROPECIA) 1 MG tablet, Take 1 tablet (1 mg total) by mouth daily., Disp: 90 tablet, Rfl: 2  Observations/Objective: Patient is well-developed, well-nourished in no acute  distress.  Resting comfortably  at home.  Head is normocephalic, atraumatic.  No labored breathing.  Speech is clear and coherent with logical content.  Patient is alert and oriented at baseline.    Assessment and Plan:   1. Acute bacterial sinusitis (Primary)  - amoxicillin-clavulanate (AUGMENTIN) 875-125 MG tablet; Take 1 tablet by mouth 2 (two) times daily for 7 days.  Dispense: 14 tablet; Refill: 0 - promethazine-dextromethorphan (PROMETHAZINE-DM) 6.25-15 MG/5ML syrup; Take 5 mLs by mouth 4 (four) times daily as needed for cough.  Dispense: 118 mL; Refill: 0  URI recommendations: - Increased rest - Increasing Fluids - Acetaminophen / ibuprofen as needed for fever/pain.  - Salt water gargling, chloraseptic spray and throat lozenges - Mucinex  (plain) if mucus is present and increasing.  - Saline nasal spray if congestion or if nasal passages feel dry. - Humidifying the air.   Reviewed side effects, risks and benefits of medication.    Patient acknowledged agreement and understanding of the plan.   Past Medical, Surgical, Social History, Allergies, and Medications have been Reviewed.   Follow Up Instructions: I discussed the assessment and treatment plan with the patient. The patient was provided an opportunity to ask questions and all were answered. The patient agreed with the plan and demonstrated an understanding of the instructions.  A copy of instructions were sent to the patient via MyChart unless otherwise noted below.    The patient was advised to call back or seek an in-person evaluation if the symptoms worsen or if the condition fails to improve as anticipated.    Freddy Finner, NP

## 2023-02-17 DIAGNOSIS — R21 Rash and other nonspecific skin eruption: Secondary | ICD-10-CM | POA: Diagnosis not present

## 2023-03-06 DIAGNOSIS — L309 Dermatitis, unspecified: Secondary | ICD-10-CM | POA: Diagnosis not present
